# Patient Record
Sex: Male | Born: 1955
Health system: Southern US, Community
[De-identification: ages and names within clinical notes are randomized; demographics above are authoritative.]

## PROBLEM LIST (undated history)

## (undated) DIAGNOSIS — R55 Syncope and collapse: Secondary | ICD-10-CM

## (undated) DIAGNOSIS — E785 Hyperlipidemia, unspecified: Secondary | ICD-10-CM

## (undated) DIAGNOSIS — F339 Major depressive disorder, recurrent, unspecified: Secondary | ICD-10-CM

## (undated) DIAGNOSIS — R0600 Dyspnea, unspecified: Secondary | ICD-10-CM

## (undated) DIAGNOSIS — M5432 Sciatica, left side: Secondary | ICD-10-CM

## (undated) DIAGNOSIS — R0602 Shortness of breath: Secondary | ICD-10-CM

## (undated) DIAGNOSIS — F4310 Post-traumatic stress disorder, unspecified: Secondary | ICD-10-CM

## (undated) DIAGNOSIS — M1990 Unspecified osteoarthritis, unspecified site: Secondary | ICD-10-CM

## (undated) DIAGNOSIS — L02212 Cutaneous abscess of back [any part, except buttock]: Secondary | ICD-10-CM

## (undated) DIAGNOSIS — R001 Bradycardia, unspecified: Secondary | ICD-10-CM

## (undated) DIAGNOSIS — K5900 Constipation, unspecified: Secondary | ICD-10-CM

## (undated) DIAGNOSIS — D72829 Elevated white blood cell count, unspecified: Secondary | ICD-10-CM

## (undated) DIAGNOSIS — R739 Hyperglycemia, unspecified: Secondary | ICD-10-CM

## (undated) HISTORY — DX: Hyperlipidemia, unspecified: E78.5

## (undated) HISTORY — PX: CARDIAC CATHETERIZATION: SHX172

## (undated) HISTORY — DX: Shortness of breath: R06.02

## (undated) HISTORY — DX: Dyspnea, unspecified: R06.00

## (undated) HISTORY — DX: Cutaneous abscess of back (any part, except buttock): L02.212

## (undated) HISTORY — DX: Hyperglycemia, unspecified: R73.9

## (undated) HISTORY — DX: Major depressive disorder, recurrent, unspecified: F33.9

## (undated) HISTORY — DX: Unspecified osteoarthritis, unspecified site: M19.90

## (undated) HISTORY — PX: CYST EXCISION: SHX5701

## (undated) HISTORY — DX: Elevated white blood cell count, unspecified: D72.829

## (undated) HISTORY — DX: Bradycardia, unspecified: R00.1

## (undated) HISTORY — DX: Syncope and collapse: R55

## (undated) HISTORY — DX: Post-traumatic stress disorder, unspecified: F43.10

## (undated) HISTORY — PX: VASECTOMY: SHX75

---

## 1998-04-20 ENCOUNTER — Emergency Department (HOSPITAL_COMMUNITY): Admission: EM | Admit: 1998-04-20 | Discharge: 1998-04-20 | Payer: Self-pay | Admitting: Emergency Medicine

## 1998-04-20 ENCOUNTER — Encounter: Payer: Self-pay | Admitting: Emergency Medicine

## 2001-07-23 ENCOUNTER — Encounter: Admission: RE | Admit: 2001-07-23 | Discharge: 2001-07-23 | Payer: Self-pay

## 2001-10-29 ENCOUNTER — Encounter: Admission: RE | Admit: 2001-10-29 | Discharge: 2001-11-03 | Payer: Self-pay

## 2001-11-05 ENCOUNTER — Encounter: Admission: RE | Admit: 2001-11-05 | Discharge: 2001-11-05 | Payer: Self-pay

## 2002-05-20 ENCOUNTER — Encounter: Admission: RE | Admit: 2002-05-20 | Discharge: 2002-05-20 | Payer: Self-pay | Admitting: Family Medicine

## 2002-05-21 ENCOUNTER — Encounter: Payer: Self-pay | Admitting: Sports Medicine

## 2002-05-21 ENCOUNTER — Encounter: Admission: RE | Admit: 2002-05-21 | Discharge: 2002-05-21 | Payer: Self-pay | Admitting: Sports Medicine

## 2002-06-09 ENCOUNTER — Encounter: Admission: RE | Admit: 2002-06-09 | Discharge: 2002-06-09 | Payer: Self-pay | Admitting: Family Medicine

## 2002-06-18 ENCOUNTER — Encounter: Admission: RE | Admit: 2002-06-18 | Discharge: 2002-06-18 | Payer: Self-pay | Admitting: Family Medicine

## 2003-01-14 ENCOUNTER — Encounter: Admission: RE | Admit: 2003-01-14 | Discharge: 2003-01-14 | Payer: Self-pay | Admitting: Sports Medicine

## 2006-04-03 DIAGNOSIS — F339 Major depressive disorder, recurrent, unspecified: Secondary | ICD-10-CM | POA: Insufficient documentation

## 2006-04-03 DIAGNOSIS — F4310 Post-traumatic stress disorder, unspecified: Secondary | ICD-10-CM | POA: Insufficient documentation

## 2006-04-03 DIAGNOSIS — I10 Essential (primary) hypertension: Secondary | ICD-10-CM | POA: Insufficient documentation

## 2011-04-09 ENCOUNTER — Ambulatory Visit (INDEPENDENT_AMBULATORY_CARE_PROVIDER_SITE_OTHER): Payer: Medicare Other | Admitting: General Surgery

## 2011-04-09 ENCOUNTER — Encounter (INDEPENDENT_AMBULATORY_CARE_PROVIDER_SITE_OTHER): Payer: Self-pay | Admitting: General Surgery

## 2011-04-09 VITALS — BP 136/82 | HR 67 | Temp 97.8°F | Ht 67.0 in | Wt 175.2 lb

## 2011-04-09 DIAGNOSIS — L02219 Cutaneous abscess of trunk, unspecified: Secondary | ICD-10-CM

## 2011-04-09 DIAGNOSIS — L02212 Cutaneous abscess of back [any part, except buttock]: Secondary | ICD-10-CM

## 2011-04-09 MED ORDER — HYDROCODONE-ACETAMINOPHEN 5-325 MG PO TABS: 1.0000 | ORAL_TABLET | Freq: Four times a day (QID) | ORAL | Status: AC | PRN

## 2011-04-09 NOTE — Progress Notes (Signed)
Patient ID: Frank Gill, male   DOB: 02/23/1955, 56 y.o.   MRN: [id number]  Chief Complaint  Patient presents with  . Follow-up    seb cyst on back    [name] is a 56 y.o. male.   HPI  He is referred by Helene Kelp, PA for evaluation of an infected sebaceous cyst of the back. He presented to his primary care physician's office February 25 and was noted to have cellulitis and abscess on the back. He was started on Bactrim. He was seen yesterday and the area is larger.  It has begun to spontaneously drain some.  He is referred here for further evaluation and treatment.  Past Medical History  Diagnosis Date  . Arthritis   . Hyperlipidemia     History reviewed. No pertinent past surgical history.  History reviewed. No pertinent family history.  Social History History  Substance Use Topics  . Smoking status: Current Everyday Smoker  . Smokeless tobacco: Not on file  . Alcohol Use: Yes     occ    No Known Allergies  Current Outpatient Prescriptions  Medication Sig Dispense Refill  . calcium-vitamin D (OSCAL WITH D) 500-200 MG-UNIT per tablet Take 1 tablet by mouth daily.      Marland Kitchen LIPOFEN 150 MG CAPS       . loratadine (CLARITIN) 10 MG tablet Take 10 mg by mouth daily.      . mupirocin ointment (BACTROBAN) 2 %       . naproxen (NAPROSYN) 250 MG tablet Take by mouth 2 (two) times daily with a meal.      . sulfamethoxazole-trimethoprim (BACTRIM DS) 800-160 MG per tablet       . traMADol (ULTRAM) 50 MG tablet       . HYDROcodone-acetaminophen (NORCO) 5-325 MG per tablet Take 1 tablet by mouth every 6 (six) hours as needed for pain.  20 tablet  1    Review of Systems Review of Systems  Constitutional: Negative for fever and chills.  Musculoskeletal: Positive for back pain (at cyst site).    Blood pressure 136/82, pulse 67, temperature 97.8 F (36.6 C), temperature source Temporal, height 5\' 7"  (1.702 m), weight 175 lb 3.2 oz (79.47 kg), SpO2 96.00%.  Physical  Exam Physical Exam  Constitutional: He appears well-developed and well-nourished. No distress.  Musculoskeletal:       6 cm red, fluctuant area in mid back.    Data Reviewed Note from Helene Kelp, PA  Assessment    Back abscess secondary to infected sebaceous cyst that is not responding to antibiotic treatment.    Plan    Incision and drainage here in the office. Removed a bandage Thursday morning and then clean the area in the shower twice a day and place a dry bandage over the wound. Continue the antibiotics until they are gone. We'll give him Vicodin for pain. Return visit 4 weeks.  Procedure: The abscess in the back were sterilely prepped. It was anesthetized with Xylocaine. A full thickness elliptical incision was made and a plug of skin and subcutaneous tissue removed. A significant amount of purulent debris and fluid was evacuated. A cyst capsule was identified and debrided. Bleeding was controlled with cautery. The wound was packed with iodoform gauze. A bulky dressing was applied. He tolerated the procedure well.        [name] 04/09/2011, 5:22 PM

## 2011-04-09 NOTE — Patient Instructions (Signed)
Remove bandage Thursday morning then clean the area in the shower twice a day and apply a dry dressing.  Call for heavy bleeding or other wound problems.

## 2011-05-01 ENCOUNTER — Encounter (INDEPENDENT_AMBULATORY_CARE_PROVIDER_SITE_OTHER): Payer: Medicare Other | Admitting: General Surgery

## 2012-06-01 ENCOUNTER — Other Ambulatory Visit: Payer: Self-pay | Admitting: *Deleted

## 2012-06-01 MED ORDER — TRAMADOL HCL 50 MG PO TABS: ORAL_TABLET | ORAL | Status: DC

## 2012-06-01 NOTE — Telephone Encounter (Signed)
Ultram RX sent to pharmacy. NTBS

## 2012-06-01 NOTE — Telephone Encounter (Signed)
LAST REFILL 04/10/12. NOT SEEN SINCE 10/14/11 BY ACM. PLEASE PRINT AND CALL PATIENT.

## 2012-06-25 ENCOUNTER — Other Ambulatory Visit: Payer: Self-pay

## 2012-07-07 ENCOUNTER — Other Ambulatory Visit: Payer: Self-pay | Admitting: Nurse Practitioner

## 2012-07-08 MED ORDER — TRAMADOL HCL 50 MG PO TABS: ORAL_TABLET | ORAL | Status: DC

## 2012-07-08 NOTE — Telephone Encounter (Signed)
Last filled 06/01/12, has appt 07/24/12

## 2012-07-24 ENCOUNTER — Ambulatory Visit: Payer: Self-pay | Admitting: Nurse Practitioner

## 2012-07-31 ENCOUNTER — Other Ambulatory Visit: Payer: Self-pay | Admitting: *Deleted

## 2012-07-31 ENCOUNTER — Other Ambulatory Visit: Payer: Self-pay | Admitting: Family Medicine

## 2012-07-31 MED ORDER — NAPROXEN SODIUM 550 MG PO TABS: 550.0000 mg | ORAL_TABLET | Freq: Two times a day (BID) | ORAL | Status: DC

## 2012-08-04 ENCOUNTER — Other Ambulatory Visit: Payer: Self-pay | Admitting: Family Medicine

## 2012-08-04 MED ORDER — TRAMADOL HCL 50 MG PO TABS: ORAL_TABLET | ORAL | Status: DC

## 2012-08-04 MED ORDER — ATORVASTATIN CALCIUM 20 MG PO TABS: 20.0000 mg | ORAL_TABLET | Freq: Every day | ORAL | Status: DC

## 2012-08-04 NOTE — Telephone Encounter (Signed)
Refilled medicines for one month and will need to come in and be seen.

## 2012-08-04 NOTE — Telephone Encounter (Signed)
Tramadol last filled 07/08/12, last seen 09/13, but has appt with you 08/10/12. If approved have your nurse call pt for pickup, it will print

## 2012-08-05 ENCOUNTER — Other Ambulatory Visit: Payer: Self-pay | Admitting: *Deleted

## 2012-08-05 NOTE — Telephone Encounter (Signed)
LAST RF 02/17/12. PLEASE PRINT AND HAVE NURSE CALL PT IF APPROVED. THANKS.

## 2012-08-06 NOTE — Telephone Encounter (Signed)
Patient needsto follow-up

## 2012-08-10 ENCOUNTER — Other Ambulatory Visit: Payer: Self-pay

## 2012-08-10 ENCOUNTER — Ambulatory Visit: Payer: Self-pay | Admitting: Family Medicine

## 2012-08-10 NOTE — Telephone Encounter (Signed)
Being seen 08/11/12  Frank Gill    If approved print and have nurse call patient to pick up

## 2012-08-10 NOTE — Telephone Encounter (Signed)
Refill medicine in follow up visit tomorrow.

## 2012-08-10 NOTE — Telephone Encounter (Signed)
No labs for lipids since EPIC  Has appt 08/11/12  [name]    If approved print Tramadol and have nurse call patient to pick up

## 2012-08-11 ENCOUNTER — Encounter: Payer: Self-pay | Admitting: Family Medicine

## 2012-08-11 ENCOUNTER — Ambulatory Visit (INDEPENDENT_AMBULATORY_CARE_PROVIDER_SITE_OTHER): Payer: Medicare Other

## 2012-08-11 ENCOUNTER — Ambulatory Visit (INDEPENDENT_AMBULATORY_CARE_PROVIDER_SITE_OTHER): Payer: Medicare Other | Admitting: Family Medicine

## 2012-08-11 VITALS — BP 132/81 | HR 56 | Temp 97.2°F | Wt 162.8 lb

## 2012-08-11 DIAGNOSIS — Z0000 Encounter for general adult medical examination without abnormal findings: Secondary | ICD-10-CM

## 2012-08-11 DIAGNOSIS — M549 Dorsalgia, unspecified: Secondary | ICD-10-CM

## 2012-08-11 DIAGNOSIS — E785 Hyperlipidemia, unspecified: Secondary | ICD-10-CM

## 2012-08-11 LAB — POCT CBC
Granulocyte percent: 64.8 %G (ref 37–80)
HCT, POC: 46.9 % (ref 43.5–53.7)
Hemoglobin: 16.9 g/dL (ref 14.1–18.1)
Lymph, poc: 2.3 (ref 0.6–3.4)
MCH, POC: 33.4 pg — AB (ref 27–31.2)
MCHC: 36.1 g/dL — AB (ref 31.8–35.4)
MCV: 92.7 fL (ref 80–97)
MPV: 9.4 fL (ref 0–99.8)
POC Granulocyte: 4.6 (ref 2–6.9)
POC LYMPH PERCENT: 31.7 %L (ref 10–50)
Platelet Count, POC: 202 10*3/uL (ref 142–424)
RBC: 5.1 M/uL (ref 4.69–6.13)
RDW, POC: 13 %
WBC: 7.1 10*3/uL (ref 4.6–10.2)

## 2012-08-11 LAB — COMPLETE METABOLIC PANEL WITH GFR
ALT: 21 U/L (ref 0–53)
AST: 24 U/L (ref 0–37)
Albumin: 4.4 g/dL (ref 3.5–5.2)
Alkaline Phosphatase: 72 U/L (ref 39–117)
BUN: 17 mg/dL (ref 6–23)
CO2: 31 mEq/L (ref 19–32)
Calcium: 9.6 mg/dL (ref 8.4–10.5)
Chloride: 106 mEq/L (ref 96–112)
Creat: 0.93 mg/dL (ref 0.50–1.35)
GFR, Est African American: >89 mL/min
GFR, Est Non African American: >89 mL/min
Glucose, Bld: 99 mg/dL (ref 70–99)
Potassium: 4.5 mEq/L (ref 3.5–5.3)
Sodium: 142 mEq/L (ref 135–145)
Total Bilirubin: 0.5 mg/dL (ref 0.3–1.2)
Total Protein: 6.6 g/dL (ref 6.0–8.3)

## 2012-08-11 LAB — LIPID PANEL
Cholesterol: 125 mg/dL (ref 0–200)
HDL: 28 mg/dL — ABNORMAL LOW (ref >39)
LDL Cholesterol: 67 mg/dL (ref 0–99)
Total CHOL/HDL Ratio: 4.5 Ratio
Triglycerides: 151 mg/dL — ABNORMAL HIGH (ref <150)
VLDL: 30 mg/dL (ref 0–40)

## 2012-08-11 LAB — TSH: TSH: 0.483 u[IU]/mL (ref 0.350–4.500)

## 2012-08-11 LAB — PSA: PSA: 0.74 ng/mL (ref <=4.00)

## 2012-08-11 MED ORDER — NAPROXEN SODIUM 550 MG PO TABS: 550.0000 mg | ORAL_TABLET | Freq: Two times a day (BID) | ORAL | Status: DC

## 2012-08-11 MED ORDER — FENOFIBRATE 150 MG PO CAPS: 150.0000 mg | ORAL_CAPSULE | ORAL | Status: DC

## 2012-08-11 MED ORDER — TRAMADOL HCL 50 MG PO TABS: 50.0000 mg | ORAL_TABLET | Freq: Four times a day (QID) | ORAL | Status: DC | PRN

## 2012-08-11 MED ORDER — GABAPENTIN 300 MG PO CAPS: 300.0000 mg | ORAL_CAPSULE | Freq: Three times a day (TID) | ORAL | Status: DC

## 2012-08-11 MED ORDER — ATORVASTATIN CALCIUM 20 MG PO TABS: 20.0000 mg | ORAL_TABLET | Freq: Every day | ORAL | Status: DC

## 2012-08-11 NOTE — Progress Notes (Signed)
Subjective:    Patient ID: Frank Gill, male    DOB: Aug 11, 1955, 57 y.o.   MRN: [id number]  HPI This 57 y.o. male presents for evaluation of follow up on back pain which is radiating down his left leg.  He c/o left leg pain which is worse when he walks.  He denies any strength issues.  He has hx of DDD of the LS spine and radicular symptoms.  He states in the past he did see a neurosurgeon who advised him not to get surgery.  He does not want back surgery.  He is in moderate to severe pain in his back and has a lot of pain radiating down his left leg.   Review of Systems   C/o back pain with pain radiating down left leg. No chest pain, SOB, HA, dizziness, vision change, N/V, diarrhea, constipation, dysuria, urinary urgency or frequency or rash.  Objective:   Physical Exam Vital signs noted  Well developed well nourished male.  HEENT - Head atraumatic Normocephalic                Eyes - PERRLA, Conjuctiva - clear Sclera- Clear EOMI                Ears - EAC's Wnl TM's Wnl Gross Hearing WNL                Nose - Nares patent                 Throat - oropharanx wnl Respiratory - Lungs CTA bilateral Cardiac - RRR S1 and S2 without murmur GI - Abdomen soft Nontender and bowel sounds active x 4 Extremities - No edema. Neuro - Grossly intact. MS- TTP LS spine and negative SLR bilateral.   Results for orders placed in visit on 08/11/12 (from the past 24 hour(s))  POCT CBC     Status: Abnormal   Collection Time    08/11/12  9:54 AM      Result Value Range   WBC 7.1  4.6 - 10.2 K/uL   Lymph, poc 2.3  0.6 - 3.4   POC LYMPH PERCENT 31.7  10 - 50 %L   MID (cbc)    0 - 0.9   POC MID %    0 - 12 %M   POC Granulocyte 4.6  2 - 6.9   Granulocyte percent 64.8  37 - 80 %G   RBC 5.1  4.69 - 6.13 M/uL   Hemoglobin 16.9  14.1 - 18.1 g/dL   HCT, POC 40.9  81.1 - 53.7 %   MCV 92.7  80 - 97 fL   MCH, POC 33.4 (*) 27 - 31.2 pg   MCHC 36.1 (*) 31.8 - 35.4 g/dL   RDW, POC 91.4     Platelet  Count, POC 202.0  142 - 424 K/uL   MPV 9.4  0 - 99.8 fL     Preliminary LS xray read - DDD of the LS spine with decreased disk space L4-5. Assessment & Plan:  Back pain with radiation - Plan: traMADol (ULTRAM) 50 MG tablet, naproxen sodium (ANAPROX) 550 MG tablet, gabapentin (NEURONTIN) 300 MG capsule, DG Lumbar Spine 2-3 Views. Discussed that if pain is intractable or worsens then he may need referral to pain management. Discussed taking neurontin first qhs for a week then increasing slowly up to tid.  Other and unspecified hyperlipidemia - Plan: Fenofibrate (LIPOFEN) 150 MG CAPS, atorvastatin (LIPITOR) 20 MG tablet, Lipid panel  Routine general medical examination at a health care facility - Plan: POCT CBC, Lipid panel, TSH, COMPLETE METABOLIC PANEL WITH GFR, PSA  Follow up in 3 months

## 2012-08-11 NOTE — Patient Instructions (Signed)
Back Pain, Adult  Low back pain is very common. About 1 in 5 people have back pain. The cause of low back pain is rarely dangerous. The pain often gets better over time. About half of people with a sudden onset of back pain feel better in just 2 weeks. About 8 in 10 people feel better by 6 weeks.   CAUSES  Some common causes of back pain include:  · Strain of the muscles or ligaments supporting the spine.  · Wear and tear (degeneration) of the spinal discs.  · Arthritis.  · Direct injury to the back.  DIAGNOSIS  Most of the time, the direct cause of low back pain is not known. However, back pain can be treated effectively even when the exact cause of the pain is unknown. Answering your caregiver's questions about your overall health and symptoms is one of the most accurate ways to make sure the cause of your pain is not dangerous. If your caregiver needs more information, he or she may order lab work or imaging tests (X-rays or MRIs). However, even if imaging tests show changes in your back, this usually does not require surgery.  HOME CARE INSTRUCTIONS  For many people, back pain returns. Since low back pain is rarely dangerous, it is often a condition that people can learn to manage on their own.   · Remain active. It is stressful on the back to sit or stand in one place. Do not sit, drive, or stand in one place for more than 30 minutes at a time. Take short walks on level surfaces as soon as pain allows. Try to increase the length of time you walk each day.  · Do not stay in bed. Resting more than 1 or 2 days can delay your recovery.  · Do not avoid exercise or work. Your body is made to move. It is not dangerous to be active, even though your back may hurt. Your back will likely heal faster if you return to being active before your pain is gone.  · Pay attention to your body when you  bend and lift. Many people have less discomfort when lifting if they bend their knees, keep the load close to their bodies, and  avoid twisting. Often, the most comfortable positions are those that put less stress on your recovering back.  · Find a comfortable position to sleep. Use a firm mattress and lie on your side with your knees slightly bent. If you lie on your back, put a pillow under your knees.  · Only take over-the-counter or prescription medicines as directed by your caregiver. Over-the-counter medicines to reduce pain and inflammation are often the most helpful. Your caregiver may prescribe muscle relaxant drugs. These medicines help dull your pain so you can more quickly return to your normal activities and healthy exercise.  · Put ice on the injured area.  · Put ice in a plastic bag.  · Place a towel between your skin and the bag.  · Leave the ice on for 15-20 minutes, 3-4 times a day for the first 2 to 3 days. After that, ice and heat may be alternated to reduce pain and spasms.  · Ask your caregiver about trying back exercises and gentle massage. This may be of some benefit.  · Avoid feeling anxious or stressed. Stress increases muscle tension and can worsen back pain. It is important to recognize when you are anxious or stressed and learn ways to manage it. Exercise is a great option.  SEEK MEDICAL CARE IF:  · You have pain that is not relieved with rest or   medicine.  · You have pain that does not improve in 1 week.  · You have new symptoms.  · You are generally not feeling well.  SEEK IMMEDIATE MEDICAL CARE IF:   · You have pain that radiates from your back into your legs.  · You develop new bowel or bladder control problems.  · You have unusual weakness or numbness in your arms or legs.  · You develop nausea or vomiting.  · You develop abdominal pain.  · You feel faint.  Document Released: 01/21/2005 Document Revised: 07/23/2011 Document Reviewed: 06/11/2010  [employer]® Patient Information ©2014 [employer].

## 2012-08-11 NOTE — Telephone Encounter (Signed)
Pt seen this am with Frank Gill

## 2012-08-11 NOTE — Telephone Encounter (Signed)
Please advise 

## 2012-08-11 NOTE — Telephone Encounter (Signed)
Pt seen this am with [name] and rx given at office visit for tramadol

## 2012-09-10 ENCOUNTER — Telehealth: Payer: Self-pay | Admitting: Family Medicine

## 2012-09-10 NOTE — Telephone Encounter (Signed)
Per last office note patient should be taking atorvastatin 20mg . Notified caregiver. Verbalized understanding

## 2012-10-15 ENCOUNTER — Telehealth: Payer: Self-pay | Admitting: Family Medicine

## 2012-10-16 ENCOUNTER — Other Ambulatory Visit: Payer: Self-pay

## 2012-10-16 DIAGNOSIS — M549 Dorsalgia, unspecified: Secondary | ICD-10-CM

## 2012-10-16 NOTE — Telephone Encounter (Signed)
ast seen 08/11/12  B [place]   If approved print and route to nurse

## 2012-10-19 MED ORDER — TRAMADOL HCL 50 MG PO TABS: 50.0000 mg | ORAL_TABLET | Freq: Four times a day (QID) | ORAL | Status: DC | PRN

## 2012-10-19 NOTE — Telephone Encounter (Signed)
Pt aware, rx ready. 

## 2012-10-21 ENCOUNTER — Other Ambulatory Visit: Payer: Self-pay | Admitting: Family Medicine

## 2012-10-21 ENCOUNTER — Telehealth: Payer: Self-pay | Admitting: *Deleted

## 2012-10-21 DIAGNOSIS — M549 Dorsalgia, unspecified: Secondary | ICD-10-CM

## 2012-10-21 MED ORDER — TRAMADOL HCL 50 MG PO TABS: 50.0000 mg | ORAL_TABLET | Freq: Four times a day (QID) | ORAL | Status: DC | PRN

## 2012-10-21 NOTE — Telephone Encounter (Signed)
Unable to contact pt to inform that rx for tramadol was printed and ready.

## 2012-10-21 NOTE — Telephone Encounter (Signed)
Refill sent.

## 2012-11-17 ENCOUNTER — Ambulatory Visit: Payer: Medicare Other | Admitting: Family Medicine

## 2012-12-15 ENCOUNTER — Ambulatory Visit: Payer: Medicare Other | Admitting: Family Medicine

## 2013-01-04 ENCOUNTER — Ambulatory Visit (INDEPENDENT_AMBULATORY_CARE_PROVIDER_SITE_OTHER): Payer: Medicare Other | Admitting: Family Medicine

## 2013-01-04 ENCOUNTER — Encounter: Payer: Self-pay | Admitting: Family Medicine

## 2013-01-04 VITALS — BP 147/87 | HR 54 | Temp 97.1°F | Ht 65.0 in | Wt 165.0 lb

## 2013-01-04 DIAGNOSIS — Z23 Encounter for immunization: Secondary | ICD-10-CM

## 2013-01-04 DIAGNOSIS — M549 Dorsalgia, unspecified: Secondary | ICD-10-CM

## 2013-01-04 DIAGNOSIS — E785 Hyperlipidemia, unspecified: Secondary | ICD-10-CM

## 2013-01-04 MED ORDER — TRAMADOL HCL 50 MG PO TABS: 50.0000 mg | ORAL_TABLET | Freq: Four times a day (QID) | ORAL | Status: DC | PRN

## 2013-01-04 MED ORDER — FENOFIBRATE 150 MG PO CAPS: 150.0000 mg | ORAL_CAPSULE | ORAL | Status: DC

## 2013-01-04 MED ORDER — NAPROXEN SODIUM 550 MG PO TABS: 550.0000 mg | ORAL_TABLET | Freq: Two times a day (BID) | ORAL | Status: DC

## 2013-01-04 MED ORDER — GABAPENTIN 600 MG PO TABS: 600.0000 mg | ORAL_TABLET | Freq: Three times a day (TID) | ORAL | Status: DC

## 2013-01-04 NOTE — Addendum Note (Signed)
Addended by: Orma Render F on: 01/04/2013 12:16 PM   Modules accepted: Orders

## 2013-01-04 NOTE — Patient Instructions (Addendum)
Back Pain, Adult Low back pain is very common. About 1 in 5 people have back pain.The cause of low back pain is rarely dangerous. The pain often gets better over time.About half of people with a sudden onset of back pain feel better in just 2 weeks. About 8 in 10 people feel better by 6 weeks.  CAUSES Some common causes of back pain include:  Strain of the muscles or ligaments supporting the spine.  Wear and tear (degeneration) of the spinal discs.  Arthritis.  Direct injury to the back. DIAGNOSIS Most of the time, the direct cause of low back pain is not known.However, back pain can be treated effectively even when the exact cause of the pain is unknown.Answering your caregiver's questions about your overall health and symptoms is one of the most accurate ways to make sure the cause of your pain is not dangerous. If your caregiver needs more information, he or she may order lab work or imaging tests (X-rays or MRIs).However, even if imaging tests show changes in your back, this usually does not require surgery. HOME CARE INSTRUCTIONS For many people, back pain returns.Since low back pain is rarely dangerous, it is often a condition that people can learn to manageon their own.   Remain active. It is stressful on the back to sit or stand in one place. Do not sit, drive, or stand in one place for more than 30 minutes at a time. Take short walks on level surfaces as soon as pain allows.Try to increase the length of time you walk each day.  Do not stay in bed.Resting more than 1 or 2 days can delay your recovery.  Do not avoid exercise or work.Your body is made to move.It is not dangerous to be active, even though your back may hurt.Your back will likely heal faster if you return to being active before your pain is gone.  Pay attention to your body when you bend and lift. Many people have less discomfortwhen lifting if they bend their knees, keep the load close to their bodies,and  avoid twisting. Often, the most comfortable positions are those that put less stress on your recovering back.  Find a comfortable position to sleep. Use a firm mattress and lie on your side with your knees slightly bent. If you lie on your back, put a pillow under your knees.  Only take over-the-counter or prescription medicines as directed by your caregiver. Over-the-counter medicines to reduce pain and inflammation are often the most helpful.Your caregiver may prescribe muscle relaxant drugs.These medicines help dull your pain so you can more quickly return to your normal activities and healthy exercise.  Put ice on the injured area.  Put ice in a plastic bag.  Place a towel between your skin and the bag.  Leave the ice on for 15-20 minutes, 03-04 times a day for the first 2 to 3 days. After that, ice and heat may be alternated to reduce pain and spasms.  Ask your caregiver about trying back exercises and gentle massage. This may be of some benefit.  Avoid feeling anxious or stressed.Stress increases muscle tension and can worsen back pain.It is important to recognize when you are anxious or stressed and learn ways to manage it.Exercise is a great option. SEEK MEDICAL CARE IF:  You have pain that is not relieved with rest or medicine.  You have pain that does not improve in 1 week.  You have new symptoms.  You are generally not feeling well. SEEK   IMMEDIATE MEDICAL CARE IF:   You have pain that radiates from your back into your legs.  You develop new bowel or bladder control problems.  You have unusual weakness or numbness in your arms or legs.  You develop nausea or vomiting.  You develop abdominal pain.  You feel faint. Document Released: 01/21/2005 Document Revised: 07/23/2011 Document Reviewed: 06/11/2010 [facility] Patient Information 2014 [place], [place]. Tetanus, Diphtheria, Pertussis (Tdap) Vaccine What You Need to Know WHY GET VACCINATED? Tetanus, diphtheria  and pertussis can be very serious diseases, even for adolescents and adults. Tdap vaccine can protect [place] from these diseases. TETANUS (Lockjaw) causes painful muscle tightening and stiffness, usually all over the body.  It can lead to tightening of muscles in the head and neck so you can't open your mouth, swallow, or sometimes even breathe. Tetanus kills about 1 out of 5 people who are infected. DIPHTHERIA can cause a thick coating to form in the back of the throat.  It can lead to breathing problems, paralysis, heart failure, and death. PERTUSSIS (Whooping Cough) causes severe coughing spells, which can cause difficulty breathing, vomiting and disturbed sleep.  It can also lead to weight loss, incontinence, and rib fractures. Up to 2 in 100 adolescents and 5 in 100 adults with pertussis are hospitalized or have complications, which could include pneumonia and death. These diseases are caused by bacteria. Diphtheria and pertussis are spread from person to person through coughing or sneezing. Tetanus enters the body through cuts, scratches, or wounds. Before vaccines, the [place] saw as many as 200,000 cases a year of diphtheria and pertussis, and hundreds of cases of tetanus. Since vaccination began, tetanus and diphtheria have dropped by about 99% and pertussis by about 80%. TDAP VACCINE Tdap vaccine can protect adolescents and adults from tetanus, diphtheria, and pertussis. One dose of Tdap is routinely given at age 19 or 44. People who did not get Tdap at that age should get it as soon as possible. Tdap is especially important for health care professionals and anyone having close contact with a baby younger than 12 months. Pregnant women should get a dose of Tdap during every pregnancy, to protect the newborn from pertussis. Infants are most at risk for severe, life-threatening complications from pertussis. A similar vaccine, called Td, protects from tetanus and diphtheria, but not  pertussis. A Td booster should be given every 10 years. Tdap may be given as one of these boosters if you have not already gotten a dose. Tdap may also be given after a severe cut or burn to prevent tetanus infection. Your doctor can give you more information. Tdap may safely be given at the same time as other vaccines. SOME PEOPLE SHOULD NOT GET THIS VACCINE  If you ever had a life-threatening allergic reaction after a dose of any tetanus, diphtheria, or pertussis containing vaccine, OR if you have a severe allergy to any part of this vaccine, you should not get Tdap. Tell your doctor if you have any severe allergies.  If you had a coma, or long or multiple seizures within 7 days after a childhood dose of DTP or DTaP, you should not get Tdap, unless a cause other than the vaccine was found. You can still get Td.  Talk to your doctor if you:  have epilepsy or another nervous system problem,  had severe pain or swelling after any vaccine containing diphtheria, tetanus or pertussis,  ever had Guillain-Barr Syndrome (GBS),  aren't feeling well on the day the shot  is scheduled. RISKS OF A VACCINE REACTION With any medicine, including vaccines, there is a chance of side effects. These are usually mild and go away on their own, but serious reactions are also possible. Brief fainting spells can follow a vaccination, leading to injuries from falling. Sitting or lying down for about 15 minutes can help prevent these. Tell your doctor if you feel dizzy or light-headed, or have vision changes or ringing in the ears. Mild problems following Tdap (Did not interfere with activities)  Pain where the shot was given (about 3 in 4 adolescents or 2 in 3 adults)  Redness or swelling where the shot was given (about 1 person in 5)  Mild fever of at least 100.33F (up to about 1 in 25 adolescents or 1 in 100 adults)  Headache (about 3 or 4 people in 10)  Tiredness (about 1 person in 3 or 4)  Nausea,  vomiting, diarrhea, stomach ache (up to 1 in 4 adolescents or 1 in 10 adults)  Chills, body aches, sore joints, rash, swollen glands (uncommon) Moderate problems following Tdap (Interfered with activities, but did not require medical attention)  Pain where the shot was given (about 1 in 5 adolescents or 1 in 100 adults)  Redness or swelling where the shot was given (up to about 1 in 16 adolescents or 1 in 25 adults)  Fever over 102F (about 1 in 100 adolescents or 1 in 250 adults)  Headache (about 3 in 20 adolescents or 1 in 10 adults)  Nausea, vomiting, diarrhea, stomach ache (up to 1 or 3 people in 100)  Swelling of the entire arm where the shot was given (up to about 3 in 100). Severe problems following Tdap (Unable to perform usual activities, required medical attention)  Swelling, severe pain, bleeding and redness in the arm where the shot was given (rare). A severe allergic reaction could occur after any vaccine (estimated less than 1 in a million doses). WHAT IF THERE IS A SERIOUS REACTION? What should I look for?  Look for anything that concerns you, such as signs of a severe allergic reaction, very high fever, or behavior changes. Signs of a severe allergic reaction can include hives, swelling of the face and throat, difficulty breathing, a fast heartbeat, dizziness, and weakness. These would start a few minutes to a few hours after the vaccination. What should I do?  If you think it is a severe allergic reaction or other emergency that can't wait, call 9-1-1 or get the person to the nearest hospital. Otherwise, call your doctor.  Afterward, the reaction should be reported to the "Vaccine Adverse Event Reporting System" (VAERS). Your doctor might file this report, or you can do it yourself through the VAERS web site at www.vaers.LAgents.no, or by calling 1-731-834-2360. VAERS is only for reporting reactions. They do not give medical advice.  THE NATIONAL VACCINE INJURY  COMPENSATION PROGRAM The National Vaccine Injury Compensation Program (VICP) is a federal program that was created to compensate people who may have been injured by certain vaccines. Persons who believe they may have been injured by a vaccine can learn about the program and about filing a claim by calling 1-832 069 9040 or visiting the VICP website at SpiritualWord.at. HOW CAN I LEARN MORE?  Ask your doctor.  Call your local or state health department.  Contact the Centers for Disease Control and Prevention (CDC):  Call 8085794231 or visit CDC's website at PicCapture.uy. CDC Tdap Vaccine VIS (06/13/11) Document Released: 07/23/2011 Document Revised: 05/18/2012 Document  Reviewed: 05/13/2012 [employer] Patient Information 2014 [place], [place]. Influenza Virus Vaccine injection (Fluarix) What is this medicine? INFLUENZA VIRUS VACCINE (in floo EN zuh VAHY ruhs vak SEEN) helps to reduce the risk of getting influenza also known as the flu. This medicine may be used for other purposes; ask your health care provider or pharmacist if you have questions. COMMON BRAND NAME(S): Fluarix, Fluzone What should I tell my health care provider before I take this medicine? They need to know if you have any of these conditions: -bleeding disorder like hemophilia -fever or infection -Guillain-Barre syndrome or other neurological problems -immune system problems -infection with the human immunodeficiency virus (HIV) or AIDS -low blood platelet counts -multiple sclerosis -an unusual or allergic reaction to influenza virus vaccine, eggs, chicken proteins, latex, gentamicin, other medicines, foods, dyes or preservatives -pregnant or trying to get pregnant -breast-feeding How should I use this medicine? This vaccine is for injection into a muscle. It is given by a health care professional. A copy of Vaccine Information Statements will be given before each vaccination. Read this sheet  carefully each time. The sheet may change frequently. Talk to your pediatrician regarding the use of this medicine in children. Special care may be needed. Overdosage: If you think you have taken too much of this medicine contact a poison control center or emergency room at once. NOTE: This medicine is only for you. Do not share this medicine with others. What if I miss a dose? This does not apply. What may interact with this medicine? -chemotherapy or radiation therapy -medicines that lower your immune system like etanercept, anakinra, infliximab, and adalimumab -medicines that treat or prevent blood clots like warfarin -phenytoin -steroid medicines like prednisone or cortisone -theophylline -vaccines This list may not describe all possible interactions. Give your health care provider a list of all the medicines, herbs, non-prescription drugs, or dietary supplements you use. Also tell them if you smoke, drink alcohol, or use illegal drugs. Some items may interact with your medicine. What should I watch for while using this medicine? Report any side effects that do not go away within 3 days to your doctor or health care professional. Call your health care provider if any unusual symptoms occur within 6 weeks of receiving this vaccine. You may still catch the flu, but the illness is not usually as bad. You cannot get the flu from the vaccine. The vaccine will not protect against colds or other illnesses that may cause fever. The vaccine is needed every year. What side effects may I notice from receiving this medicine? Side effects that you should report to your doctor or health care professional as soon as possible: -allergic reactions like skin rash, itching or hives, swelling of the face, lips, or tongue Side effects that usually do not require medical attention (report to your doctor or health care professional if they continue or are bothersome): -fever -headache -muscle aches and  pains -pain, tenderness, redness, or swelling at site where injected -weak or tired This list may not describe all possible side effects. Call your doctor for medical advice about side effects. You may report side effects to FDA at 1-800-FDA-1088. Where should I keep my medicine? This vaccine is only given in a clinic, pharmacy, doctor's office, or other health care setting and will not be stored at home. NOTE: This sheet is a summary. It may not cover all possible information. If you have questions about this medicine, talk to your doctor, pharmacist, or health care provider.  2014,  Elsevier/Gold Standard. (2007-08-19 09:30:40)

## 2013-01-04 NOTE — Progress Notes (Signed)
   Subjective:    Patient ID: Frank Gill, male    DOB: 03-23-1955, 57 y.o.   MRN: [id number]  HPI  This 57 y.o. male presents for evaluation of periodic follow up.  He has hx of hyperlipidemia and chronic Back pain.  He c/o pain at night that radiates down his left leg.  He has hx of DDD.  Review of Systems No chest pain, SOB, HA, dizziness, vision change, N/V, diarrhea, constipation, dysuria, urinary urgency or frequency, myalgias, arthralgias or rash.     Objective:   Physical Exam Vital signs noted  Well developed well nourished male.  HEENT - Head atraumatic Normocephalic                Eyes - PERRLA, Conjuctiva - clear Sclera- Clear EOMI                Ears - EAC's Wnl TM's Wnl Gross Hearing WNL                Throat - oropharanx wnl Respiratory - Lungs CTA bilateral Cardiac - RRR S1 and S2 without murmur GI - Abdomen soft Nontender and bowel sounds active x 4 Extremities - No edema. Neuro - Grossly intact.       Assessment & Plan:  Hyperlipidemia - Plan: POCT CBC, CMP14+EGFR, Lipid panel, Fenofibrate (LIPOFEN) 150 MG CAPS  Back pain with radiation - Plan: traMADol (ULTRAM) 50 MG tablet, naproxen sodium (ANAPROX) 550 MG tablet, gabapentin (NEURONTIN) 600 MG tablet. Increase neurontin to 600mg  tid.  Recommend MRI of LS spine if not better.  Follow up prn.  Other and unspecified hyperlipidemia - Plan: Fenofibrate (LIPOFEN) 150 MG CAPS  Follow up in 3 months Deatra Canter FNP

## 2013-01-04 NOTE — Addendum Note (Signed)
Addended by: Bearl Mulberry on: 01/04/2013 10:03 AM   Modules accepted: Orders

## 2013-01-05 LAB — LIPID PANEL
Chol/HDL Ratio: 3.4 ratio units (ref 0.0–5.0)
Cholesterol, Total: 126 mg/dL (ref 100–199)
HDL: 37 mg/dL — ABNORMAL LOW (ref >39)
LDL Calculated: 77 mg/dL (ref 0–99)
Triglycerides: 60 mg/dL (ref 0–149)
VLDL Cholesterol Cal: 12 mg/dL (ref 5–40)

## 2013-01-05 LAB — CBC WITH DIFFERENTIAL
Basophils Absolute: 0 10*3/uL (ref 0.0–0.2)
Basos: 1 %
Eos: 11 %
Eosinophils Absolute: 0.7 10*3/uL — ABNORMAL HIGH (ref 0.0–0.4)
HCT: 45.4 % (ref 37.5–51.0)
Hemoglobin: 16 g/dL (ref 12.6–17.7)
Immature Grans (Abs): 0 10*3/uL (ref 0.0–0.1)
Immature Granulocytes: 0 %
Lymphocytes Absolute: 1.7 10*3/uL (ref 0.7–3.1)
Lymphs: 26 %
MCH: 32.5 pg (ref 26.6–33.0)
MCHC: 35.2 g/dL (ref 31.5–35.7)
MCV: 92 fL (ref 79–97)
Monocytes Absolute: 0.5 10*3/uL (ref 0.1–0.9)
Monocytes: 7 %
Neutrophils Absolute: 3.8 10*3/uL (ref 1.4–7.0)
Neutrophils Relative %: 55 %
Platelets: 228 10*3/uL (ref 150–379)
RBC: 4.93 x10E6/uL (ref 4.14–5.80)
RDW: 14.2 % (ref 12.3–15.4)
WBC: 6.7 10*3/uL (ref 3.4–10.8)

## 2013-01-05 LAB — CMP14+EGFR
ALT: 17 IU/L (ref 0–44)
AST: 23 IU/L (ref 0–40)
Albumin/Globulin Ratio: 2.6 — ABNORMAL HIGH (ref 1.1–2.5)
Albumin: 4.7 g/dL (ref 3.5–5.5)
Alkaline Phosphatase: 46 IU/L (ref 39–117)
BUN/Creatinine Ratio: 13 (ref 9–20)
BUN: 14 mg/dL (ref 6–24)
CO2: 25 mmol/L (ref 18–29)
Calcium: 9.5 mg/dL (ref 8.7–10.2)
Chloride: 102 mmol/L (ref 97–108)
Creatinine, Ser: 1.07 mg/dL (ref 0.76–1.27)
GFR calc Af Amer: 88 mL/min/{1.73_m2} (ref >59)
GFR calc non Af Amer: 76 mL/min/{1.73_m2} (ref >59)
Globulin, Total: 1.8 g/dL (ref 1.5–4.5)
Glucose: 81 mg/dL (ref 65–99)
Potassium: 4.2 mmol/L (ref 3.5–5.2)
Sodium: 141 mmol/L (ref 134–144)
Total Bilirubin: 0.4 mg/dL (ref 0.0–1.2)
Total Protein: 6.5 g/dL (ref 6.0–8.5)

## 2013-02-08 ENCOUNTER — Ambulatory Visit (INDEPENDENT_AMBULATORY_CARE_PROVIDER_SITE_OTHER): Payer: Medicare HMO | Admitting: Family Medicine

## 2013-02-08 ENCOUNTER — Inpatient Hospital Stay (HOSPITAL_COMMUNITY)
Admission: EM | Admit: 2013-02-08 | Discharge: 2013-02-09 | DRG: 287 | Disposition: A | Discharge status: Home / Self Care | Payer: Medicare HMO | Attending: Family Medicine | Admitting: Family Medicine

## 2013-02-08 ENCOUNTER — Encounter (HOSPITAL_COMMUNITY): Payer: Self-pay | Admitting: Emergency Medicine

## 2013-02-08 ENCOUNTER — Emergency Department (HOSPITAL_COMMUNITY): Payer: Medicare HMO

## 2013-02-08 ENCOUNTER — Encounter: Payer: Self-pay | Admitting: Family Medicine

## 2013-02-08 VITALS — BP 147/81 | HR 74 | Temp 97.3°F | Ht 65.0 in | Wt 168.9 lb

## 2013-02-08 DIAGNOSIS — Z79899 Other long term (current) drug therapy: Secondary | ICD-10-CM

## 2013-02-08 DIAGNOSIS — R079 Chest pain, unspecified: Secondary | ICD-10-CM

## 2013-02-08 DIAGNOSIS — R0602 Shortness of breath: Secondary | ICD-10-CM

## 2013-02-08 DIAGNOSIS — I249 Acute ischemic heart disease, unspecified: Secondary | ICD-10-CM | POA: Diagnosis present

## 2013-02-08 DIAGNOSIS — I10 Essential (primary) hypertension: Secondary | ICD-10-CM | POA: Diagnosis present

## 2013-02-08 DIAGNOSIS — R9389 Abnormal findings on diagnostic imaging of other specified body structures: Secondary | ICD-10-CM

## 2013-02-08 DIAGNOSIS — R9431 Abnormal electrocardiogram [ECG] [EKG]: Secondary | ICD-10-CM

## 2013-02-08 DIAGNOSIS — E785 Hyperlipidemia, unspecified: Secondary | ICD-10-CM | POA: Diagnosis present

## 2013-02-08 DIAGNOSIS — Z8249 Family history of ischemic heart disease and other diseases of the circulatory system: Secondary | ICD-10-CM

## 2013-02-08 DIAGNOSIS — L02212 Cutaneous abscess of back [any part, except buttock]: Secondary | ICD-10-CM

## 2013-02-08 DIAGNOSIS — F4310 Post-traumatic stress disorder, unspecified: Secondary | ICD-10-CM

## 2013-02-08 DIAGNOSIS — R55 Syncope and collapse: Secondary | ICD-10-CM

## 2013-02-08 DIAGNOSIS — F17200 Nicotine dependence, unspecified, uncomplicated: Secondary | ICD-10-CM | POA: Diagnosis present

## 2013-02-08 DIAGNOSIS — Z7982 Long term (current) use of aspirin: Secondary | ICD-10-CM

## 2013-02-08 DIAGNOSIS — Z720 Tobacco use: Secondary | ICD-10-CM | POA: Diagnosis present

## 2013-02-08 DIAGNOSIS — IMO0002 Reserved for concepts with insufficient information to code with codable children: Secondary | ICD-10-CM

## 2013-02-08 DIAGNOSIS — I200 Unstable angina: Principal | ICD-10-CM | POA: Diagnosis present

## 2013-02-08 DIAGNOSIS — J189 Pneumonia, unspecified organism: Secondary | ICD-10-CM

## 2013-02-08 DIAGNOSIS — F339 Major depressive disorder, recurrent, unspecified: Secondary | ICD-10-CM

## 2013-02-08 DIAGNOSIS — E876 Hypokalemia: Secondary | ICD-10-CM | POA: Diagnosis present

## 2013-02-08 HISTORY — DX: Constipation, unspecified: K59.00

## 2013-02-08 HISTORY — DX: Sciatica, left side: M54.32

## 2013-02-08 HISTORY — DX: Shortness of breath: R06.02

## 2013-02-08 LAB — COMPREHENSIVE METABOLIC PANEL
ALT: 21 U/L (ref 0–53)
AST: 26 U/L (ref 0–37)
Albumin: 4.3 g/dL (ref 3.5–5.2)
Alkaline Phosphatase: 44 U/L (ref 39–117)
BILIRUBIN TOTAL: 0.3 mg/dL (ref 0.3–1.2)
BUN: 15 mg/dL (ref 6–23)
CALCIUM: 8.9 mg/dL (ref 8.4–10.5)
CHLORIDE: 104 meq/L (ref 96–112)
CO2: 27 meq/L (ref 19–32)
CREATININE: 1.08 mg/dL (ref 0.50–1.35)
GFR, EST AFRICAN AMERICAN: 86 mL/min — AB (ref >90)
GFR, EST NON AFRICAN AMERICAN: 74 mL/min — AB (ref >90)
GLUCOSE: 89 mg/dL (ref 70–99)
Potassium: 3.4 mEq/L — ABNORMAL LOW (ref 3.7–5.3)
Sodium: 143 mEq/L (ref 137–147)
Total Protein: 6.9 g/dL (ref 6.0–8.3)

## 2013-02-08 LAB — CBC
HCT: 44 % (ref 39.0–52.0)
HEMOGLOBIN: 15.3 g/dL (ref 13.0–17.0)
MCH: 33 pg (ref 26.0–34.0)
MCHC: 34.8 g/dL (ref 30.0–36.0)
MCV: 94.8 fL (ref 78.0–100.0)
Platelets: 215 10*3/uL (ref 150–400)
RBC: 4.64 MIL/uL (ref 4.22–5.81)
RDW: 14.2 % (ref 11.5–15.5)
WBC: 6.4 10*3/uL (ref 4.0–10.5)

## 2013-02-08 LAB — TROPONIN I: Troponin I: <0.3 ng/mL (ref <0.30)

## 2013-02-08 MED ORDER — NITROGLYCERIN 0.4 MG SL SUBL: 0.4000 mg | SUBLINGUAL_TABLET | SUBLINGUAL | Status: DC | PRN

## 2013-02-08 MED ORDER — LEVOFLOXACIN 750 MG PO TABS
750.0000 mg | ORAL_TABLET | Freq: Once | ORAL | Status: AC
  Administered 2013-02-08: 750 mg via ORAL
  Filled 2013-02-08: qty 1

## 2013-02-08 MED ORDER — ATORVASTATIN CALCIUM 40 MG PO TABS
40.0000 mg | ORAL_TABLET | Freq: Every day | ORAL | Status: DC
  Administered 2013-02-08: 40 mg via ORAL
  Filled 2013-02-08 (×2): qty 1

## 2013-02-08 MED ORDER — FENOFIBRATE 160 MG PO TABS
160.0000 mg | ORAL_TABLET | Freq: Every day | ORAL | Status: DC
  Administered 2013-02-09: 160 mg via ORAL
  Filled 2013-02-08: qty 1

## 2013-02-08 MED ORDER — ADULT MULTIVITAMIN W/MINERALS CH
1.0000 | ORAL_TABLET | Freq: Every day | ORAL | Status: DC
  Administered 2013-02-08 – 2013-02-09 (×2): 1 via ORAL
  Filled 2013-02-08 (×2): qty 1

## 2013-02-08 MED ORDER — GABAPENTIN 600 MG PO TABS
600.0000 mg | ORAL_TABLET | Freq: Three times a day (TID) | ORAL | Status: DC
  Administered 2013-02-08 – 2013-02-09 (×2): 600 mg via ORAL
  Filled 2013-02-08 (×4): qty 1

## 2013-02-08 MED ORDER — TRAMADOL HCL 50 MG PO TABS: 50.0000 mg | ORAL_TABLET | Freq: Four times a day (QID) | ORAL | Status: DC | PRN

## 2013-02-08 MED ORDER — LORATADINE 10 MG PO TABS
10.0000 mg | ORAL_TABLET | Freq: Every day | ORAL | Status: DC
  Administered 2013-02-09: 10 mg via ORAL
  Filled 2013-02-08: qty 1

## 2013-02-08 MED ORDER — DOCUSATE SODIUM 100 MG PO CAPS
100.0000 mg | ORAL_CAPSULE | Freq: Every day | ORAL | Status: DC
  Administered 2013-02-08: 100 mg via ORAL
  Filled 2013-02-08 (×2): qty 1

## 2013-02-08 MED ORDER — HEPARIN BOLUS VIA INFUSION
3500.0000 [IU] | Freq: Once | INTRAVENOUS | Status: AC
  Administered 2013-02-08: 3500 [IU] via INTRAVENOUS
  Filled 2013-02-08: qty 3500

## 2013-02-08 MED ORDER — SODIUM CHLORIDE 0.9 % IJ SOLN
3.0000 mL | Freq: Two times a day (BID) | INTRAMUSCULAR | Status: DC
  Administered 2013-02-08: 3 mL via INTRAVENOUS

## 2013-02-08 MED ORDER — ASPIRIN 81 MG PO TABS: 81.0000 mg | ORAL_TABLET | Freq: Every day | ORAL | Status: DC

## 2013-02-08 MED ORDER — ASPIRIN 81 MG PO CHEW: 81.0000 mg | CHEWABLE_TABLET | Freq: Every day | ORAL | Status: DC

## 2013-02-08 MED ORDER — HEPARIN (PORCINE) IN NACL 100-0.45 UNIT/ML-% IJ SOLN
1200.0000 [IU]/h | INTRAMUSCULAR | Status: DC
  Administered 2013-02-08: 950 [IU]/h via INTRAVENOUS
  Filled 2013-02-08 (×2): qty 250

## 2013-02-08 NOTE — Patient Instructions (Signed)
Chest Pain (Nonspecific) °It is often hard to give a specific diagnosis for the cause of chest pain. There is always a chance that your pain could be related to something serious, such as a heart attack or a blood clot in the lungs. You need to follow up with your caregiver for further evaluation. °CAUSES  °· Heartburn. °· Pneumonia or bronchitis. °· Anxiety or stress. °· Inflammation around your heart (pericarditis) or lung (pleuritis or pleurisy). °· A blood clot in the lung. °· A collapsed lung (pneumothorax). It can develop suddenly on its own (spontaneous pneumothorax) or from injury (trauma) to the chest. °· Shingles infection (herpes zoster virus). °The chest wall is composed of bones, muscles, and cartilage. Any of these can be the source of the pain. °· The bones can be bruised by injury. °· The muscles or cartilage can be strained by coughing or overwork. °· The cartilage can be affected by inflammation and become sore (costochondritis). °DIAGNOSIS  °Lab tests or other studies, such as X-rays, electrocardiography, stress testing, or cardiac imaging, may be needed to find the cause of your pain.  °TREATMENT  °· Treatment depends on what may be causing your chest pain. Treatment may include: °· Acid blockers for heartburn. °· Anti-inflammatory medicine. °· Pain medicine for inflammatory conditions. °· Antibiotics if an infection is present. °· You may be advised to change lifestyle habits. This includes stopping smoking and avoiding alcohol, caffeine, and chocolate. °· You may be advised to keep your head raised (elevated) when sleeping. This reduces the chance of acid going backward from your stomach into your esophagus. °· Most of the time, nonspecific chest pain will improve within 2 to 3 days with rest and mild pain medicine. °HOME CARE INSTRUCTIONS  °· If antibiotics were prescribed, take your antibiotics as directed. Finish them even if you start to feel better. °· For the next few days, avoid physical  activities that bring on chest pain. Continue physical activities as directed. °· Do not smoke. °· Avoid drinking alcohol. °· Only take over-the-counter or prescription medicine for pain, discomfort, or fever as directed by your caregiver. °· Follow your caregiver's suggestions for further testing if your chest pain does not go away. °· Keep any follow-up appointments you made. If you do not go to an appointment, you could develop lasting (chronic) problems with pain. If there is any problem keeping an appointment, you must call to reschedule. °SEEK MEDICAL CARE IF:  °· You think you are having problems from the medicine you are taking. Read your medicine instructions carefully. °· Your chest pain does not go away, even after treatment. °· You develop a rash with blisters on your chest. °SEEK IMMEDIATE MEDICAL CARE IF:  °· You have increased chest pain or pain that spreads to your arm, neck, jaw, back, or abdomen. °· You develop shortness of breath, an increasing cough, or you are coughing up blood. °· You have severe back or abdominal pain, feel nauseous, or vomit. °· You develop severe weakness, fainting, or chills. °· You have a fever. °THIS IS AN EMERGENCY. Do not wait to see if the pain will go away. Get medical help at once. Call your local emergency services (911 in [place] Do not drive yourself to the hospital. °MAKE SURE YOU:  °· Understand these instructions. °· Will watch your condition. °· Will get help right away if you are not doing well or get worse. °Document Released: 10/31/2004 Document Revised: 04/15/2011 Document Reviewed: 08/27/2007 °Exit[employer]® Patient Information ©2014 [employer]   LL[employer]. ° °

## 2013-02-08 NOTE — ED Notes (Signed)
[facility].  Pt received 324 asa and 2 nitro pta with relieve. Pain 1/10 at this time. Pt states syncopal episode on sat.

## 2013-02-08 NOTE — ED Provider Notes (Signed)
CSN: [id number]     Arrival date & time 02/08/13  1725 History   First MD Initiated Contact with Patient 02/08/13 1729     Chief Complaint  Patient presents with  . Chest Pain   (Consider location/radiation/quality/duration/timing/severity/associated sxs/prior Treatment) HPI Comments: 58 yo male with lipids, smoking hx presents with chest pain episode.  Pt had a CP episode with near syncope on Thursday, he was diaphoretic but refused to come in for no specific reason. CP ache/ pressure, non radiating. Pt vague with hx, pt looks to family for details and does not know his medical hx well.  This am he had another epiosode from approx 9 to noon today, similar, no syncope.  Pt has intermittent exertional chest pain.  No known MI or stroke hx.  No acute onset HA.  No blood thinners.  Pt has asa and nitro PTA, no pain on my exam.  No recent surgery, long travel, blood clot hx or leg swelling.  Sent over from [facility] for eval/ admission.  Patient is a 58 y.o. male presenting with chest pain. The history is provided by the patient.  Chest Pain Associated symptoms: diaphoresis and fatigue   Associated symptoms: no abdominal pain, no back pain, no cough, no fever, no headache, no shortness of breath, not vomiting and no weakness     Past Medical History  Diagnosis Date  . Arthritis   . Hyperlipidemia    History reviewed. No pertinent past surgical history. Family History  Problem Relation Age of Onset  . Heart disease Mother   . Cancer Father   . Mental illness Sister    History  Substance Use Topics  . Smoking status: Current Every Day Smoker -- 1.00 packs/day    Types: Cigarettes    Start date: 05/21/1979  . Smokeless tobacco: Not on file  . Alcohol Use: Yes     Comment: occ    Review of Systems  Constitutional: Positive for diaphoresis and fatigue. Negative for fever and chills.  HENT: Negative for congestion.   Eyes: Negative for visual disturbance.  Respiratory: Negative for cough and  shortness of breath.   Cardiovascular: Positive for chest pain. Negative for leg swelling.  Gastrointestinal: Negative for vomiting and abdominal pain.  Genitourinary: Negative for dysuria and flank pain.  Musculoskeletal: Negative for back pain, neck pain and neck stiffness.  Skin: Negative for rash.  Neurological: Negative for weakness, light-headedness and headaches.    Allergies  Review of patient's allergies indicates no known allergies.  Home Medications   Current Outpatient Rx  Name  Route  Sig  Dispense  Refill  . atorvastatin (LIPITOR) 20 MG tablet   Oral   Take 1 tablet (20 mg total) by mouth daily.   30 tablet   3   . calcium-vitamin D (OSCAL WITH D) 500-200 MG-UNIT per tablet   Oral   Take 1 tablet by mouth daily.         . Fenofibrate (LIPOFEN) 150 MG CAPS   Oral   Take 1 capsule (150 mg total) by mouth 1 day or 1 dose.   30 each   3   . gabapentin (NEURONTIN) 600 MG tablet   Oral   Take 1 tablet (600 mg total) by mouth 3 (three) times daily.   90 tablet   11   . loratadine (CLARITIN) 10 MG tablet   Oral   Take 10 mg by mouth daily.         . naproxen sodium (ANAPROX)  550 MG tablet   Oral   Take 1 tablet (550 mg total) by mouth 2 (two) times daily with a meal.   60 tablet   3   . traMADol (ULTRAM) 50 MG tablet   Oral   Take 1 tablet (50 mg total) by mouth every 6 (six) hours as needed. TAKE ONE TABLET BY MOUTH EVERY DAY   60 tablet   3    BP 149/73  Pulse 60  Temp(Src) 97.5 F (36.4 C) (Oral)  Resp 18  SpO2 100% Physical Exam  Nursing note and vitals reviewed. Constitutional: He is oriented to person, place, and time. He appears well-developed and well-nourished.  HENT:  Head: Normocephalic and atraumatic.  Eyes: Conjunctivae are normal. Right eye exhibits no discharge. Left eye exhibits no discharge.  Neck: Normal range of motion. Neck supple. No tracheal deviation present.  Cardiovascular: Normal rate, regular rhythm and intact  distal pulses.   Murmur (2+ USB right SM) heard. Pulmonary/Chest: Effort normal and breath sounds normal.  Abdominal: Soft. He exhibits no distension. There is no tenderness. There is no guarding.  Musculoskeletal: He exhibits no edema and no tenderness.  Neurological: He is alert and oriented to person, place, and time. He has normal strength. No cranial nerve deficit. GCS eye subscore is 4. GCS verbal subscore is 5. GCS motor subscore is 6.  Do drift or droop  Skin: Skin is warm. No rash noted.  Psychiatric: He has a normal mood and affect.    ED Course  Procedures (including critical care time) Labs Review Labs Reviewed  COMPREHENSIVE METABOLIC PANEL - Abnormal; Notable for the following:    Potassium 3.4 (*)    GFR calc non Af Amer 74 (*)    GFR calc Af Amer 86 (*)    All other components within normal limits  CBC  TROPONIN I  TROPONIN I  CBC  BASIC METABOLIC PANEL  TROPONIN I  TROPONIN I  HEPARIN LEVEL (UNFRACTIONATED)   Imaging Review Dg Chest 2 View  02/08/2013   CLINICAL DATA:  Chest pain  EXAM: CHEST  2 VIEW  COMPARISON:  None.  FINDINGS: Mild elevation of the left hemidiaphragm is noted. There is mild left base atelectasis. There is mild patchy infiltrate in the right base. Elsewhere lungs are clear. Heart size and pulmonary vascularity are normal. No adenopathy. No pneumothorax. There is evidence of an old fracture of the right clavicle, healed.  IMPRESSION: Area of patchy infiltrate right base.  Mild left base atelectasis.   Electronically Signed   By: Bretta Bang M.D.   On: 02/08/2013 18:52    EKG Interpretation    Date/Time:  Monday February 08 2013 20:39:39 EST Ventricular Rate:  53 PR Interval:  180 QRS Duration: 95 QT Interval:  450 QTC Calculation: 422 R Axis:   21 Text Interpretation:  Sinus rhythm Probable lateral infarct, old Confirmed by Jaser Fullen  MD, Lovett Coffin ([id number]) on 02/08/2013 11:11:49 PM          Repeat EKG done in MUSE   Date:  02/08/2013  Rate: 53  Rhythm: normal sinus rhythm  QRS Axis: normal  Intervals: normal  ST/T Wave abnormalities: q waves lateral  Conduction Disutrbances:none  Narrative Interpretation:   Old EKG Reviewed: changes noted     MDM   1. CAP (community acquired pneumonia)   2. Syncope   3. Acute chest pain   4. Abnormal EKG   5. ACS (acute coronary syndrome)    With risk factors, diaphoresis,  abnormal ekg (q waves inf/ lateral concern for CAD. Pt had asa, cp free.  Plan for admission to tele. Recheck, no cp.   CXR showed possible pneumonia, low suspicion, po levaquin given.  Spoke with [facility] for [facility]. The patients results and plan were reviewed and discussed.   Any x-rays performed were personally reviewed by myself.   Differential diagnosis were considered with the presenting HPI.  Diagnosis: above  EKG: reviewed, q waves; repeat ekg no acute stemi  Admission/ observation were discussed with the admitting physician, patient and/or family and they are comfortable with the plan.      Enid SkeensJoshua M Maziah Smola, MD 02/08/13 [phone]

## 2013-02-08 NOTE — Progress Notes (Signed)
ANTICOAGULATION CONSULT NOTE - Initial Consult  Pharmacy Consult:  Heparin Indication: chest pain/ACS  No Known Allergies  Patient Measurements: Height: 5' 4.96" (165 cm) Weight: 168 lb 14 oz (76.6 kg) IBW/kg (Calculated) : 61.41 Heparin Dosing Weight: 77 kg  Vital Signs: Temp: 97.5 F (36.4 C) (01/05 1735) Temp src: Oral (01/05 1735) BP: 154/92 mmHg (01/05 2045) Pulse Rate: 53 (01/05 2045)  Labs:  Recent Labs  02/08/13 1735  HGB 15.3  HCT 44.0  PLT 215  CREATININE 1.08  TROPONINI <0.30    Estimated Creatinine Clearance: 71.2 ml/min (by C-G formula based on Cr of 1.08).   Medical History: Past Medical History  Diagnosis Date  . Arthritis   . Hyperlipidemia       Assessment: [age] YOM with severe chest pain and diaphoresis last night who passed out after having a bowel movement.  Chest pain returned this AM and [facility]  [facility] consulted to mange IV heparin for ACS.  Patient is not on anticoagulation PTA.  Baseline labs reviewed.   Goal of Therapy:  Heparin level 0.3-0.7 units/ml Monitor platelets by anticoagulation protocol: Yes    Plan:  - Heparin 3500 units IV bolus x 1, then - Heparin gtt at 950 units/hr - Check 6 hr HL - Daily HL / CBC - F/U KCL supplementation    Magdelena Kinsella D. Laney Potashang, PharmD, BCPS Pager:  [phone] 02/08/2013, 9:08 PM

## 2013-02-08 NOTE — Progress Notes (Signed)
   Subjective:    Patient ID: Frank Gill, male    DOB: 03/08/1955, 58 y.o.   MRN: [id number]  HPI This 58 y.o. male presents for evaluation of chest pain and shortness of breath.  He developed chest pain that was severe and had diaphoresis last night and he passed out after having bm.  His wife who accompanies him wanted him to go to the hospital but he refused. He developed chest pain again this morning c/o mild discomfort at present.   Review of Systems    C/o chest pain and SOB Denies HA, dizziness, vision change, N/V, diarrhea, constipation, dysuria, urinary urgency or frequency, myalgias, arthralgias or rash.  Objective:   Physical Exam  Vital signs noted  Well developed well nourished male.  HEENT - Head atraumatic Normocephalic                Eyes - PERRLA, Conjuctiva - clear Sclera- Clear EOMI                Ears - EAC's Wnl TM's Wnl Gross Hearing WNL                Throat - oropharanx wnl Respiratory - Lungs CTA bilateral Cardiac - RRR S1 and S2 without murmur GI - Abdomen soft Nontender and bowel sounds active x 4 Extremities - No edema. Neuro - Grossly intact.  EKG - NSR with Q waves in inferior lateral leads     Assessment & Plan:  Syncope - Plan: EKG 12-Lead  Chest pain - Plan: EKG 12-Lead  SOB (shortness of breath) - Plan: EKG 12-Lead  Call 911 and will be transported to the ED.  Start Oxygen nasal cannula 2 liters, NTG 0.4mg  SL, And ASA 81mg  x 4 po.  Deatra Canter.Jahzier Villalon J Seabron Iannello FNP

## 2013-02-08 NOTE — H&P (Addendum)
[facility] History and Physical  Frank SafeDwight D Damiani XLK:[id number] DOB: 1955-11-22 DOA: 02/08/2013  Referring physician: EDP PCP: Rudi HeapMOORE, DONALD, MD   Chief Complaint: Chest pain   HPI: Frank Gill is a 58 y.o. male who presents to the ED after an episode of chest pain that occurred earlier today.  Patient first had crushing, left sided, chest pain for a prolonged period of time with diaphoresis, SOB, on Thursday last week.  He also has been having some SOB with activity for quite some time.  He also had a syncopal episode on Thursday and one on Sat.  His wife (who herself has had 3 coronary stents), begged him to come in to the ED but he decided to wait and went to his PCPs office today.  At his PCPs office and EKG demonstrated apparently new Q waves suggestive of an anterior infarct.  911 was called and patient was sent emergently to the ED.  Review of Systems: Systems reviewed.  As above, otherwise negative  Past Medical History  Diagnosis Date  . Arthritis   . Hyperlipidemia    History reviewed. No pertinent past surgical history. Social History:  reports that he has been smoking Cigarettes.  He started smoking about 33 years ago. He has been smoking about 1.00 pack per day. He does not have any smokeless tobacco history on file. He reports that he drinks alcohol. He reports that he does not use illicit drugs.  No Known Allergies  Family History  Problem Relation Age of Onset  . Heart disease Mother   . Cancer Father   . Mental illness Sister      Prior to Admission medications   Medication Sig Start Date End Date Taking? Authorizing Provider  aspirin 81 MG tablet Take 81 mg by mouth daily.   Yes Historical Provider, MD  atorvastatin (LIPITOR) 40 MG tablet Take 40 mg by mouth at bedtime.   Yes Historical Provider, MD  CALCIUM PO Take 1 tablet by mouth daily. Takes 2000mg  every day   Yes Historical Provider, MD  docusate sodium (COLACE) 100 MG capsule Take 100 mg by mouth at  bedtime.   Yes Historical Provider, MD  Fenofibrate (LIPOFEN) 150 MG CAPS Take 1 capsule (150 mg total) by mouth 1 day or 1 dose. 01/04/13  Yes Deatra CanterWilliam J Oxford, FNP  gabapentin (NEURONTIN) 600 MG tablet Take 1 tablet (600 mg total) by mouth 3 (three) times daily. 01/04/13  Yes Deatra CanterWilliam J Oxford, FNP  GARLIC PO Take 1 capsule by mouth daily.   Yes Historical Provider, MD  loratadine (CLARITIN) 10 MG tablet Take 10 mg by mouth daily.   Yes Historical Provider, MD  Multiple Vitamin (MULTIVITAMIN WITH MINERALS) TABS tablet Take 1 tablet by mouth daily.   Yes Historical Provider, MD  naproxen sodium (ANAPROX) 550 MG tablet Take 1 tablet (550 mg total) by mouth 2 (two) times daily with a meal. 01/04/13  Yes Deatra CanterWilliam J Oxford, FNP  nitroGLYCERIN (NITROSTAT) 0.4 MG SL tablet Place 0.4 mg under the tongue every 5 (five) minutes as needed for chest pain.   Yes Historical Provider, MD  traMADol (ULTRAM) 50 MG tablet Take 50 mg by mouth every 6 (six) hours as needed for moderate pain. TAKE ONE TABLET BY MOUTH EVERY DAY 01/04/13  Yes Deatra CanterWilliam J Oxford, FNP   Physical Exam: Filed Vitals:   02/08/13 2045  BP: 154/92  Pulse: 53  Temp:   Resp: 10    BP 154/92  Pulse 53  Temp(Src) 97.5 F (36.4 C) (Oral)  Resp 10  Ht 5' 4.96" (1.65 m)  Wt 76.6 kg (168 lb 14 oz)  BMI 28.14 kg/m2  SpO2 97%  General Appearance:    Alert, oriented, no distress, appears stated age  Head:    Normocephalic, atraumatic  Eyes:    PERRL, EOMI, sclera non-icteric        Nose:   Nares without drainage or epistaxis. Mucosa, turbinates normal  Throat:   Moist mucous membranes. Oropharynx without erythema or exudate.  Neck:   Supple. No carotid bruits.  No thyromegaly.  No lymphadenopathy.   Back:     No CVA tenderness, no spinal tenderness  Lungs:     Clear to auscultation bilaterally, without wheezes, rhonchi or rales  Chest wall:    No tenderness to palpitation  Heart:    Regular rate and rhythm without murmurs, gallops, rubs   Abdomen:     Soft, non-tender, nondistended, normal bowel sounds, no organomegaly  Genitalia:    deferred  Rectal:    deferred  Extremities:   No clubbing, cyanosis or edema.  Pulses:   2+ and symmetric all extremities  Skin:   Skin color, texture, turgor normal, no rashes or lesions  Lymph nodes:   Cervical, supraclavicular, and axillary nodes normal  Neurologic:   CNII-XII intact. Normal strength, sensation and reflexes      throughout    Labs on Admission:  Basic Metabolic Panel:  Recent Labs Lab 02/08/13 1735  NA 143  K 3.4*  CL 104  CO2 27  GLUCOSE 89  BUN 15  CREATININE 1.08  CALCIUM 8.9   Liver Function Tests:  Recent Labs Lab 02/08/13 1735  AST 26  ALT 21  ALKPHOS 44  BILITOT 0.3  PROT 6.9  ALBUMIN 4.3   No results found for this basename: LIPASE, AMYLASE,  in the last 168 hours No results found for this basename: AMMONIA,  in the last 168 hours CBC:  Recent Labs Lab 02/08/13 1735  WBC 6.4  HGB 15.3  HCT 44.0  MCV 94.8  PLT 215   Cardiac Enzymes:  Recent Labs Lab 02/08/13 1735  TROPONINI <0.30    BNP (last 3 results) No results found for this basename: PROBNP,  in the last 8760 hours CBG: No results found for this basename: GLUCAP,  in the last 168 hours  Radiological Exams on Admission: Dg Chest 2 View  02/08/2013   CLINICAL DATA:  Chest pain  EXAM: CHEST  2 VIEW  COMPARISON:  None.  FINDINGS: Mild elevation of the left hemidiaphragm is noted. There is mild left base atelectasis. There is mild patchy infiltrate in the right base. Elsewhere lungs are clear. Heart size and pulmonary vascularity are normal. No adenopathy. No pneumothorax. There is evidence of an old fracture of the right clavicle, healed.  IMPRESSION: Area of patchy infiltrate right base.  Mild left base atelectasis.   Electronically Signed   By: Bretta Bang M.D.   On: 02/08/2013 18:52    EKG: Independently reviewed.  Assessment/Plan Principal Problem:   ACS (acute  coronary syndrome) Active Problems:   Syncope   1. ACS with Q waves - suggestive of "old" MI, probably had MI on Thursday of last week.  Admitting to tele, heparin gtt, cards consult in AM, 2d echo ordered, given ongoing mild SOB suspect some wall motion abnormality may be visible.  Given no H/O cardiac problems and obvious Q wave findings on EKG as well as a  HEART score of 7 (no repol disturbance but giving him 2 points for the new Q wave findings), I suspect that the patient likely will end up needing heart cath, keeping NPO after midnight. 2. Infiltrate on CXR - despite the questionable small infiltrate on CXR patient has been having no cough, no fever, no WBC, no other signs or symptoms of PNA so will hold off on PNA treatment at this time.    Code Status: Full  Family Communication: Wife at bedside Disposition Plan: Admit to inpatient   Time spent: 70 min  GARDNER, JARED M. [facility] Pager [phone]  If 7AM-7PM, please contact the day team taking care of the patient [email or web address] Password [email or web address] 02/08/2013, 9:06 PM

## 2013-02-09 ENCOUNTER — Encounter (HOSPITAL_COMMUNITY): Payer: Self-pay | Admitting: *Deleted

## 2013-02-09 ENCOUNTER — Encounter (HOSPITAL_COMMUNITY)
Admission: EM | Disposition: A | Discharge status: Home / Self Care | Payer: Self-pay | Source: Home / Self Care | Attending: Internal Medicine

## 2013-02-09 ENCOUNTER — Inpatient Hospital Stay (HOSPITAL_COMMUNITY): Payer: Medicare HMO

## 2013-02-09 DIAGNOSIS — R0602 Shortness of breath: Secondary | ICD-10-CM | POA: Diagnosis present

## 2013-02-09 DIAGNOSIS — F17200 Nicotine dependence, unspecified, uncomplicated: Secondary | ICD-10-CM

## 2013-02-09 DIAGNOSIS — R9431 Abnormal electrocardiogram [ECG] [EKG]: Secondary | ICD-10-CM

## 2013-02-09 DIAGNOSIS — E785 Hyperlipidemia, unspecified: Secondary | ICD-10-CM | POA: Diagnosis present

## 2013-02-09 DIAGNOSIS — I200 Unstable angina: Principal | ICD-10-CM

## 2013-02-09 DIAGNOSIS — Z720 Tobacco use: Secondary | ICD-10-CM | POA: Diagnosis present

## 2013-02-09 HISTORY — PX: LEFT HEART CATHETERIZATION WITH CORONARY ANGIOGRAM: SHX5451

## 2013-02-09 LAB — CBC
HCT: 43.8 % (ref 39.0–52.0)
Hemoglobin: 15.2 g/dL (ref 13.0–17.0)
MCH: 32.8 pg (ref 26.0–34.0)
MCHC: 34.7 g/dL (ref 30.0–36.0)
MCV: 94.4 fL (ref 78.0–100.0)
PLATELETS: 220 10*3/uL (ref 150–400)
RBC: 4.64 MIL/uL (ref 4.22–5.81)
RDW: 14.2 % (ref 11.5–15.5)
WBC: 6.4 10*3/uL (ref 4.0–10.5)

## 2013-02-09 LAB — PROTIME-INR
INR: 1.05 (ref 0.00–1.49)
PROTHROMBIN TIME: 13.5 s (ref 11.6–15.2)

## 2013-02-09 LAB — HEPARIN LEVEL (UNFRACTIONATED): Heparin Unfractionated: 0.13 IU/mL — ABNORMAL LOW (ref 0.30–0.70)

## 2013-02-09 LAB — BASIC METABOLIC PANEL
BUN: 14 mg/dL (ref 6–23)
CALCIUM: 8.6 mg/dL (ref 8.4–10.5)
CO2: 26 mEq/L (ref 19–32)
Chloride: 102 mEq/L (ref 96–112)
Creatinine, Ser: 1.07 mg/dL (ref 0.50–1.35)
GFR calc Af Amer: 87 mL/min — ABNORMAL LOW (ref >90)
GFR calc non Af Amer: 75 mL/min — ABNORMAL LOW (ref >90)
Glucose, Bld: 97 mg/dL (ref 70–99)
Potassium: 3.3 mEq/L — ABNORMAL LOW (ref 3.7–5.3)
SODIUM: 141 meq/L (ref 137–147)

## 2013-02-09 LAB — TROPONIN I
Troponin I: <0.3 ng/mL (ref <0.30)
Troponin I: <0.3 ng/mL (ref <0.30)

## 2013-02-09 SURGERY — LEFT HEART CATHETERIZATION WITH CORONARY ANGIOGRAM
Anesthesia: LOCAL

## 2013-02-09 MED ORDER — HEPARIN (PORCINE) IN NACL 2-0.9 UNIT/ML-% IJ SOLN
INTRAMUSCULAR | Status: AC
  Filled 2013-02-09: qty 1000

## 2013-02-09 MED ORDER — FENTANYL CITRATE 0.05 MG/ML IJ SOLN
INTRAMUSCULAR | Status: AC
  Filled 2013-02-09: qty 2

## 2013-02-09 MED ORDER — SODIUM CHLORIDE 0.9 % IV SOLN: 250.0000 mL | INTRAVENOUS | Status: DC | PRN

## 2013-02-09 MED ORDER — SODIUM CHLORIDE 0.9 % IV SOLN: 1.0000 mL/kg/h | INTRAVENOUS | Status: DC

## 2013-02-09 MED ORDER — MIDAZOLAM HCL 2 MG/2ML IJ SOLN
INTRAMUSCULAR | Status: AC
  Filled 2013-02-09: qty 2

## 2013-02-09 MED ORDER — SODIUM CHLORIDE 0.9 % IJ SOLN: 3.0000 mL | Freq: Two times a day (BID) | INTRAMUSCULAR | Status: DC

## 2013-02-09 MED ORDER — NITROGLYCERIN 0.2 MG/ML ON CALL CATH LAB
INTRAVENOUS | Status: AC
  Filled 2013-02-09: qty 1

## 2013-02-09 MED ORDER — SODIUM CHLORIDE 0.9 % IJ SOLN: 3.0000 mL | INTRAMUSCULAR | Status: DC | PRN

## 2013-02-09 MED ORDER — ASPIRIN 81 MG PO CHEW
81.0000 mg | CHEWABLE_TABLET | ORAL | Status: AC
  Administered 2013-02-09: 81 mg via ORAL
  Filled 2013-02-09: qty 1

## 2013-02-09 MED ORDER — VERAPAMIL HCL 2.5 MG/ML IV SOLN
INTRAVENOUS | Status: AC
  Filled 2013-02-09: qty 2

## 2013-02-09 MED ORDER — HEPARIN BOLUS VIA INFUSION
2000.0000 [IU] | Freq: Once | INTRAVENOUS | Status: AC
Start: 2013-02-09 — End: 2013-02-09
  Administered 2013-02-09: 2000 [IU] via INTRAVENOUS
  Filled 2013-02-09: qty 2000

## 2013-02-09 MED ORDER — LIDOCAINE HCL (PF) 1 % IJ SOLN
INTRAMUSCULAR | Status: AC
  Filled 2013-02-09: qty 30

## 2013-02-09 MED ORDER — SODIUM CHLORIDE 0.9 % IV SOLN: INTRAVENOUS | Status: DC

## 2013-02-09 MED ORDER — HEPARIN SODIUM (PORCINE) 1000 UNIT/ML IJ SOLN
INTRAMUSCULAR | Status: AC
  Filled 2013-02-09: qty 1

## 2013-02-09 MED ORDER — POTASSIUM CHLORIDE CRYS ER 20 MEQ PO TBCR
40.0000 meq | EXTENDED_RELEASE_TABLET | Freq: Once | ORAL | Status: AC
  Administered 2013-02-09: 40 meq via ORAL
  Filled 2013-02-09: qty 2

## 2013-02-09 NOTE — Discharge Instructions (Signed)
Chest Pain Observation It is often hard to give a specific diagnosis for the cause of chest pain. Among other possibilities your symptoms might be caused by inadequate oxygen delivery to your heart (angina). Angina that is not treated or evaluated can lead to a heart attack (myocardial infarction) or death. Blood tests, electrocardiograms, and X-rays may have been done to help determine a possible cause of your chest pain. After evaluation and observation, your health care provider has determined that it is unlikely your pain was caused by an unstable condition that requires hospitalization. However, a full evaluation of your pain may need to be completed, with additional diagnostic testing as directed. It is very important to keep your follow-up appointments. Not keeping your follow-up appointments could result in permanent heart damage, disability, or death. If there is any problem keeping your follow-up appointments, you must call your health care provider. HOME CARE INSTRUCTIONS  Due to the slight chance that your pain could be angina, it is important to follow your health care provider's treatment plan and also maintain a healthy lifestyle:  Maintain or work toward achieving a healthy weight.  Stay physically active and exercise regularly.  Decrease your salt intake.  Eat a balanced, healthy diet. Talk to a dietician to learn about heart healthy foods.  Increase your fiber intake by including whole grains, vegetables, fruits, and nuts in your diet.  Avoid situations that cause stress, anger, or depression.  Take medicines as advised by your health care provider. Report any side effects to your health care provider. Do not stop medicines or adjust the dosages on your own.  Quit smoking. Do not use nicotine patches or gum until you check with your health care provider.  Keep your blood pressure, blood sugar, and cholesterol levels within normal limits.  Limit alcohol intake to no more than  1 drink per day for women that are not pregnant and 2 drinks per day for men.  Do not abuse drugs. SEEK IMMEDIATE MEDICAL CARE IF: You have severe chest pain or pressure which may include symptoms such as:  You feel pain or pressure in you arms, neck, jaw, or back.  You have severe back or abdominal pain, feel sick to your stomach (nauseous), or throw up (vomit).  You are sweating profusely.  You are having a fast or irregular heartbeat.  You feel short of breath while at rest.  You notice increasing shortness of breath during rest, sleep, or with activity.  You have chest pain that does not get better after rest or after taking your usual medicine.  You wake from sleep with chest pain.  You are unable to sleep because you cannot breathe.  You develop a frequent cough or you are coughing up blood.  You feel dizzy, faint, or experience extreme fatigue.  You develop severe weakness, dizziness, fainting, or chills. Any of these symptoms may represent a serious problem that is an emergency. Do not wait to see if the symptoms will go away. Call your local emergency services (911 in the [place] Do not drive yourself to the hospital. MAKE SURE YOU:  Understand these instructions.  Will watch your condition.  Will get help right away if you are not doing well or get worse. Document Released: 02/23/2010 Document Revised: 09/23/2012 Document Reviewed: 07/23/2012 [facility] Patient Information 2014 [place], [place].  Cholesterol Cholesterol is a type of fat. Your body needs a small amount of cholesterol, but too much can cause health problems. Certain problems include heart attacks, strokes,  and not enough blood flow to your heart, brain, kidneys, or feet. You get cholesterol in 2 ways:  Naturally.  By eating certain foods. HOME CARE  Eat a low-fat diet:  Eat less eggs, whole dairy products (whole milk, cheese, and butter), fatty meats, and fried foods.  Eat more fruits,  vegetables, whole-wheat breads, lean chicken, and fish.  Follow your exercise program as told by your doctor.  Keep your weight at a healthy level. Talk to your doctor about what is right for you.  Only take medicine as told by your doctor.  Get your cholesterol checked once a year or as told by your doctor. MAKE SURE YOU:  Understand these instructions.  Will watch your condition.  Will get help right away if you are not doing well or get worse. Document Released: 04/19/2008 Document Revised: 04/15/2011 Document Reviewed: 04/19/2008 [facility] Patient Information 2014 [place], [place].  Radial Site Care Refer to this sheet in the next few weeks. These instructions provide you with information on caring for yourself after your procedure. Your caregiver may also give you more specific instructions. Your treatment has been planned according to current medical practices, but problems sometimes occur. Call your caregiver if you have any problems or questions after your procedure. HOME CARE INSTRUCTIONS  You may shower the day after the procedure.Remove the bandage (dressing) and gently wash the site with plain soap and water.Gently pat the site dry.  Do not apply powder or lotion to the site.  Do not submerge the affected site in water for 3 to 5 days.  Inspect the site at least twice daily.  Do not flex or bend the affected arm for 24 hours.  No lifting over 5 pounds (2.3 kg) for 5 days after your procedure.  Do not drive home if you are discharged the same day of the procedure. Have someone else drive you.  You may drive 24 hours after the procedure unless otherwise instructed by your caregiver.  Do not operate machinery or power tools for 24 hours.  A responsible adult should be with you for the first 24 hours after you arrive home. What to expect:  Any bruising will usually fade within 1 to 2 weeks.  Blood that collects in the tissue (hematoma) may be painful to the  touch. It should usually decrease in size and tenderness within 1 to 2 weeks. SEEK IMMEDIATE MEDICAL CARE IF:  You have unusual pain at the radial site.  You have redness, warmth, swelling, or pain at the radial site.  You have drainage (other than a small amount of blood on the dressing).  You have chills.  You have a fever or persistent symptoms for more than 72 hours.  You have a fever and your symptoms suddenly get worse.  Your arm becomes pale, cool, tingly, or numb.  You have heavy bleeding from the site. Hold pressure on the site. Document Released: 02/23/2010 Document Revised: 04/15/2011 Document Reviewed: 02/23/2010 [facility] Patient Information 2014 [place], [place]. You Can Quit Smoking If you are ready to quit smoking or are thinking about it, congratulations! You have chosen to help yourself be healthier and live longer! There are lots of different ways to quit smoking. Nicotine gum, nicotine patches, a nicotine inhaler, or nicotine nasal spray can help with physical craving. Hypnosis, support groups, and medicines help break the habit of smoking. TIPS TO GET OFF AND STAY OFF CIGARETTES  Learn to predict your moods. Do not let a bad situation be your excuse  to have a cigarette. Some situations in your life might tempt you to have a cigarette.  Ask friends and co-workers not to smoke around you.  Make your home smoke-free.  Never have "just one" cigarette. It leads to wanting another and another. Remind yourself of your decision to quit.  On a card, make a list of your reasons for not smoking. Read it at least the same number of times a day as you have a cigarette. Tell yourself everyday, "I do not want to smoke. I choose not to smoke."  Ask someone at home or work to help you with your plan to quit smoking.  Have something planned after you eat or have a cup of coffee. Take a walk or get other exercise to perk you up. This will help to keep you from overeating.  Try a  relaxation exercise to calm you down and decrease your stress. Remember, you may be tense and nervous the first two weeks after you quit. This will pass.  Find new activities to keep your hands busy. Play with a pen, coin, or rubber band. Doodle or draw things on paper.  Brush your teeth right after eating. This will help cut down the craving for the taste of tobacco after meals. You can try mouthwash too.  Try gum, breath mints, or diet candy to keep something in your mouth. IF YOU SMOKE AND WANT TO QUIT:  Do not stock up on cigarettes. Never buy a carton. Wait until one pack is finished before you buy another.  Never carry cigarettes with you at work or at home.  Keep cigarettes as far away from you as possible. Leave them with someone else.  Never carry matches or a lighter with you.  Ask yourself, "Do I need this cigarette or is this just a reflex?"  Bet with someone that you can quit. Put cigarette money in a piggy bank every morning. If you smoke, you give up the money. If you do not smoke, by the end of the week, you keep the money.  Keep trying. It takes 21 days to change a habit!  Talk to your doctor about using medicines to help you quit. These include nicotine replacement gum, lozenges, or skin patches. Document Released: 11/17/2008 Document Revised: 04/15/2011 Document Reviewed: 11/17/2008 [facility] Patient Information 2014 [place], [place]. Smoking Cessation, Tips for Success YOU CAN QUIT SMOKING If you are ready to quit smoking, congratulations! You have chosen to help yourself be healthier. Cigarettes bring nicotine, tar, carbon monoxide, and other irritants into your body. Your lungs, heart, and blood vessels will be able to work better without these poisons. There are many different ways to quit smoking. Nicotine gum, nicotine patches, a nicotine inhaler, or nicotine nasal spray can help with physical craving. Hypnosis, support groups, and medicines help break the habit of  smoking. Here are some tips to help you quit for good.  Throw away all cigarettes.  Clean and remove all ashtrays from your home, work, and car.  On a card, write down your reasons for quitting. Carry the card with you and read it when you get the urge to smoke.  Cleanse your body of nicotine. Drink enough water and fluids to keep your urine clear or pale yellow. Do this after quitting to flush the nicotine from your body.  Learn to predict your moods. Do not let a bad situation be your excuse to have a cigarette. Some situations in your life might tempt you into wanting a  cigarette.  Never have "just one" cigarette. It leads to wanting another and another. Remind yourself of your decision to quit.  Change habits associated with smoking. If you smoked while driving or when feeling stressed, try other activities to replace smoking. Stand up when drinking your coffee. Brush your teeth after eating. Sit in a different chair when you read the paper. Avoid alcohol while trying to quit, and try to drink fewer caffeinated beverages. Alcohol and caffeine may urge you to smoke.  Avoid foods and drinks that can trigger a desire to smoke, such as sugary or spicy foods and alcohol.  Ask people who smoke not to smoke around you.  Have something planned to do right after eating or having a cup of coffee. Take a walk or exercise to perk you up. This will help to keep you from overeating.  Try a relaxation exercise to calm you down and decrease your stress. Remember, you may be tense and nervous for the first 2 weeks after you quit, but this will pass.  Find new activities to keep your hands busy. Play with a pen, coin, or rubber band. Doodle or draw things on paper.  Brush your teeth right after eating. This will help cut down on the craving for the taste of tobacco after meals. You can try mouthwash, too.  Use oral substitutes, such as lemon drops, carrots, a cinnamon stick, or chewing gum, in place of  cigarettes. Keep them handy so they are available when you have the urge to smoke.  When you have the urge to smoke, try deep breathing.  Designate your home as a nonsmoking area.  If you are a heavy smoker, ask your caregiver about a prescription for nicotine chewing gum. It can ease your withdrawal from nicotine.  Reward yourself. Set aside the cigarette money you save and buy yourself something nice.  Look for support from others. Join a support group or smoking cessation program. Ask someone at home or at work to help you with your plan to quit smoking.  Always ask yourself, "Do I need this cigarette or is this just a reflex?" Tell yourself, "Today, I choose not to smoke," or "I do not want to smoke." You are reminding yourself of your decision to quit, even if you do smoke a cigarette. HOW WILL I FEEL WHEN I QUIT SMOKING?  The benefits of not smoking start within days of quitting.  You may have symptoms of withdrawal because your body is used to nicotine (the addictive substance in cigarettes). You may crave cigarettes, be irritable, feel very hungry, cough often, get headaches, or have difficulty concentrating.  The withdrawal symptoms are only temporary. They are strongest when you first quit but will go away within 10 to 14 days.  When withdrawal symptoms occur, stay in control. Think about your reasons for quitting. Remind yourself that these are signs that your body is healing and getting used to being without cigarettes.  Remember that withdrawal symptoms are easier to treat than the major diseases that smoking can cause.  Even after the withdrawal is over, expect periodic urges to smoke. However, these cravings are generally short-lived and will go away whether you smoke or not. Do not smoke!  If you relapse and smoke again, do not lose hope. Most smokers quit 3 times before they are successful.  If you relapse, do not give up! Plan ahead and think about what you will do the  next time you get the urge to smoke.  LIFE AS A NONSMOKER: MAKE IT FOR A MONTH, MAKE IT FOR LIFE Day 1: Hang this page where you will see it every day. Day 2: Get rid of all ashtrays, matches, and lighters. Day 3: Drink water. Breathe deeply between sips. Day 4: Avoid places with smoke-filled air, such as bars, clubs, or the smoking section of restaurants. Day 5: Keep track of how much money you save by not smoking. Day 6: Avoid boredom. Keep a good book with you or go to the movies. Day 7: Reward yourself! One week without smoking! Day 8: Make a dental appointment to get your teeth cleaned. Day 9: Decide how you will turn down a cigarette before it is offered to you. Day 10: Review your reasons for quitting. Day 11: Distract yourself. Stay active to keep your mind off smoking and to relieve tension. Take a walk, exercise, read a book, do a crossword puzzle, or try a new hobby. Day 12: Exercise. Get off the bus before your stop or use stairs instead of escalators. Day 13: Call on friends for support and encouragement. Day 14: Reward yourself! Two weeks without smoking! Day 15: Practice deep breathing exercises. Day 16: Bet a friend that you can stay a nonsmoker. Day 17: Ask to sit in nonsmoking sections of restaurants. Day 18: Hang up "No Smoking" signs. Day 19: Think of yourself as a nonsmoker. Day 20: Each morning, tell yourself you will not smoke. Day 21: Reward yourself! Three weeks without smoking! Day 22: Think of smoking in negative ways. Remember how it stains your teeth, gives you bad breath, and leaves you short of breath. Day 23: Eat a nutritious breakfast. Day 24:Do not relive your days as a smoker. Day 25: Hold a pencil in your hand when talking on the telephone. Day 26: Tell all your friends you do not smoke. Day 27: Think about how much better food tastes. Day 28: Remember, one cigarette is one too many. Day 29: Take up a hobby that will keep your hands busy. Day 30:  Congratulations! One month without smoking! Give yourself a big reward. Your caregiver can direct you to community resources or hospitals for support, which may include:  Group support.  Education.  Hypnosis.  Subliminal therapy. Document Released: 10/20/2003 Document Revised: 04/15/2011 Document Reviewed: 07/09/2012 [facility] Patient Information 2014 [place], [place].

## 2013-02-09 NOTE — H&P (View-Only) (Signed)
CARDIOLOGY CONSULT NOTE   Patient ID: Frank Gill MRN: [id number] DOB/AGE: [birth year] 59 y.o.  Admit Date: 02/08/2013 Primary Physician: Rudi HeapMOORE, DONALD, MD Primary Cardiologist   Narda RutherfordNew   Reagann Dolce  Clinical Summary Frank Gill is a 58 y.o.male. The patient has risk factors for coronary disease. There is no prior documented disease. He had an episode several days ago with marked diaphoresis and chest discomfort. There was also an episode of presyncope at that time. He is a difficult historian. His wife has known coronary disease and was quite worried about him. He would not agree to be seen today he had chest discomfort. However yesterday, he had return of discomfort. [place] for further evaluation. Troponins are normal. He does have Q waves in leads 1 and aVL. He has been stable here in the hospital. He does smoke. There is hyperlipidemia that is being treated. There is a family history of heart disease.  The patient mentions that in addition he does have some exertional shortness of breath.   No Known Allergies  Medications Scheduled Medications: . aspirin  81 mg Oral Daily  . atorvastatin  40 mg Oral QHS  . docusate sodium  100 mg Oral QHS  . fenofibrate  160 mg Oral Daily  . gabapentin  600 mg Oral TID  . loratadine  10 mg Oral Daily  . multivitamin with minerals  1 tablet Oral Daily  . sodium chloride  3 mL Intravenous Q12H     Infusions: . heparin 1,200 Units/hr (02/09/13 0649)     PRN Medications:  nitroGLYCERIN, traMADol   Past Medical History  Diagnosis Date  . Arthritis   . Hyperlipidemia   . Shortness of breath   . Constipation   . Left sided sciatica     History reviewed. No pertinent past surgical history.  Family History  Problem Relation Age of Onset  . Heart disease Mother   . Cancer Father   . Mental illness Sister     Social History Frank Gill reports that he has been smoking Cigars.  He started  smoking about 33 years ago. He does not have any smokeless tobacco history on file. Frank Gill reports that he drinks alcohol.  Review of Systems  Patient denies fever, chills, headache, sweats, rash, change in vision, change in hearing, nausea vomiting, urinary symptoms. He does have some shortness of breath.  Physical Examination Blood pressure 154/92, pulse 56, temperature 98.1 F (36.7 C), temperature source Oral, resp. rate 18, height 5\' 5"  (1.651 m), weight 169 lb 5 oz (76.8 kg), SpO2 98.00%. Patient is oriented to person time and place. Affect is normal. His wife is in the room. There is no jugulovenous distention. Lungs are clear. Respiratory effort is nonlabored. Cardiac exam reveals S1 and S2. There no clicks or significant murmurs. The abdomen is soft. There's no peripheral edema. There no musculoskeletal deformities. There are no skin rashes.    Intake/Output Summary (Last 24 hours) at 02/09/13 0759 Last data filed at 02/09/13 0649  Gross per 24 hour  Intake      3 ml  Output    575 ml  Net   -572 ml    Telemetry:   I have reviewed telemetry today. There is normal sinus rhythm.  Prior Cardiac Testing/Procedures  Lab Results  Basic Metabolic Panel:  Recent Labs Lab 02/08/13 1735 02/09/13 0600  NA 143 141  K 3.4* 3.3*  CL 104 102  CO2 27 26  GLUCOSE 89 97  BUN 15 14  CREATININE 1.08 1.07  CALCIUM 8.9 8.6    Liver Function Tests:  Recent Labs Lab 02/08/13 1735  AST 26  ALT 21  ALKPHOS 44  BILITOT 0.3  PROT 6.9  ALBUMIN 4.3    CBC:  Recent Labs Lab 02/08/13 1735 02/09/13 0600  WBC 6.4 6.4  HGB 15.3 15.2  HCT 44.0 43.8  MCV 94.8 94.4  PLT 215 220    Cardiac Enzymes:  Recent Labs Lab 02/08/13 1735 02/08/13 2114 02/09/13 0600  TROPONINI <0.30 <0.30 <0.30    BNP: No components found with this basename: POCBNP,    Radiology: Dg Chest 2 View  02/08/2013   CLINICAL DATA:  Chest pain  EXAM: CHEST  2 VIEW  COMPARISON:  None.  FINDINGS:  Mild elevation of the left hemidiaphragm is noted. There is mild left base atelectasis. There is mild patchy infiltrate in the right base. Elsewhere lungs are clear. Heart size and pulmonary vascularity are normal. No adenopathy. No pneumothorax. There is evidence of an old fracture of the right clavicle, healed.  IMPRESSION: Area of patchy infiltrate right base.  Mild left base atelectasis.   Electronically Signed   By: Bretta Bang M.Gill.   On: 02/08/2013 18:52     ECG:   EKG reveals no acute changes. There are Q waves in leads 1 and aVL.   Impression and Recommendations    ACS (acute coronary syndrome)    The patient's presenting story is concerning for ischemic heart disease. His troponins are normal. However his worst symptoms were last week. I feel that we'll be most prudent to proceed with cardiac catheterization. I discussed this with the patient and his wife at length. They both agreed. However the patient has said that he wants his catheterization done by the radial approach. He says that he is not willing to proceed if it is to be done from his groin. I have made plans to arrange for the doctor who will do the catheterization to talk to the patient had a time to finalize the approach.    Syncope     Plan to continue to monitor the patient while he see here    Tobacco abuse      I have counseled the patient to stop smoking. I also counseled his wife.    Dyslipidemia    Patient is being treated.    Exertional shortness of breath     First we will have to rule out significant coronary disease. He needs to stop smoking. He does have a mild abnormality on his chest x-ray that we'll need followup.    Abnormal chest x-ray    There is questionable mild infiltrate on his chest x-ray. The primary care team is assessing this Foley.  I've spoken with the cath lab and made all arrangements. The patient is for cath later this morning.   Jerral Bonito, MD 02/09/2013, 7:59 AM

## 2013-02-09 NOTE — Progress Notes (Signed)
D/c orders received;IV removed with gauze on, pt remains in stable condition, pt meds and instructions reviewed and given to pt; pt d/c to home 

## 2013-02-09 NOTE — Consult Note (Addendum)
CARDIOLOGY CONSULT NOTE   Patient ID: Frank Gill MRN: [id number] DOB/AGE: [birth year] 59 y.o.  Admit Date: 02/08/2013 Primary Physician: Rudi HeapMOORE, DONALD, MD Primary Cardiologist   Narda RutherfordNew   Cindra Austad  Clinical Summary [name] is a 58 y.o.male. The patient has risk factors for coronary disease. There is no prior documented disease. He had an episode several days ago with marked diaphoresis and chest discomfort. There was also an episode of presyncope at that time. He is a difficult historian. His wife has known coronary disease and was quite worried about him. He would not agree to be seen today he had chest discomfort. However yesterday, he had return of discomfort. [place] and was transferred for further evaluation. Troponins are normal. He does have Q waves in leads 1 and aVL. He has been stable here in the hospital. He does smoke. There is hyperlipidemia that is being treated. There is a family history of heart disease.  The patient mentions that in addition he does have some exertional shortness of breath.   No Known Allergies  Medications Scheduled Medications: . aspirin  81 mg Oral Daily  . atorvastatin  40 mg Oral QHS  . docusate sodium  100 mg Oral QHS  . fenofibrate  160 mg Oral Daily  . gabapentin  600 mg Oral TID  . loratadine  10 mg Oral Daily  . multivitamin with minerals  1 tablet Oral Daily  . sodium chloride  3 mL Intravenous Q12H     Infusions: . heparin 1,200 Units/hr (02/09/13 0649)     PRN Medications:  nitroGLYCERIN, traMADol   Past Medical History  Diagnosis Date  . Arthritis   . Hyperlipidemia   . Shortness of breath   . Constipation   . Left sided sciatica     History reviewed. No pertinent past surgical history.  Family History  Problem Relation Age of Onset  . Heart disease Mother   . Cancer Father   . Mental illness Sister     Social History [name] reports that he has been smoking Cigars.  He started  smoking about 33 years ago. He does not have any smokeless tobacco history on file. [name] reports that he drinks alcohol.  Review of Systems  Patient denies fever, chills, headache, sweats, rash, change in vision, change in hearing, nausea vomiting, urinary symptoms. He does have some shortness of breath.  Physical Examination Blood pressure 154/92, pulse 56, temperature 98.1 F (36.7 C), temperature source Oral, resp. rate 18, height 5\' 5"  (1.651 m), weight 169 lb 5 oz (76.8 kg), SpO2 98.00%. Patient is oriented to person time and place. Affect is normal. His wife is in the room. There is no jugulovenous distention. Lungs are clear. Respiratory effort is nonlabored. Cardiac exam reveals S1 and S2. There no clicks or significant murmurs. The abdomen is soft. There's no peripheral edema. There no musculoskeletal deformities. There are no skin rashes.    Intake/Output Summary (Last 24 hours) at 02/09/13 0759 Last data filed at 02/09/13 0649  Gross per 24 hour  Intake      3 ml  Output    575 ml  Net   -572 ml    Telemetry:   I have reviewed telemetry today. There is normal sinus rhythm.  Prior Cardiac Testing/Procedures  Lab Results  Basic Metabolic Panel:  Recent Labs Lab 02/08/13 1735 02/09/13 0600  NA 143 141  K 3.4* 3.3*  CL 104 102  CO2 27 26  GLUCOSE 89 97  BUN 15 14  CREATININE 1.08 1.07  CALCIUM 8.9 8.6    Liver Function Tests:  Recent Labs Lab 02/08/13 1735  AST 26  ALT 21  ALKPHOS 44  BILITOT 0.3  PROT 6.9  ALBUMIN 4.3    CBC:  Recent Labs Lab 02/08/13 1735 02/09/13 0600  WBC 6.4 6.4  HGB 15.3 15.2  HCT 44.0 43.8  MCV 94.8 94.4  PLT 215 220    Cardiac Enzymes:  Recent Labs Lab 02/08/13 1735 02/08/13 2114 02/09/13 0600  TROPONINI <0.30 <0.30 <0.30    BNP: No components found with this basename: POCBNP,    Radiology: Dg Chest 2 View  02/08/2013   CLINICAL DATA:  Chest pain  EXAM: CHEST  2 VIEW  COMPARISON:  None.  FINDINGS:  Mild elevation of the left hemidiaphragm is noted. There is mild left base atelectasis. There is mild patchy infiltrate in the right base. Elsewhere lungs are clear. Heart size and pulmonary vascularity are normal. No adenopathy. No pneumothorax. There is evidence of an old fracture of the right clavicle, healed.  IMPRESSION: Area of patchy infiltrate right base.  Mild left base atelectasis.   Electronically Signed   By: Bretta Bang M.D.   On: 02/08/2013 18:52     ECG:   EKG reveals no acute changes. There are Q waves in leads 1 and aVL.   Impression and Recommendations    ACS (acute coronary syndrome)    The patient's presenting story is concerning for ischemic heart disease. His troponins are normal. However his worst symptoms were last week. I feel that we'll be most prudent to proceed with cardiac catheterization. I discussed this with the patient and his wife at length. They both agreed. However the patient has said that he wants his catheterization done by the radial approach. He says that he is not willing to proceed if it is to be done from his groin. I have made plans to arrange for the doctor who will do the catheterization to talk to the patient had a time to finalize the approach.    Syncope     Plan to continue to monitor the patient while he see here    Tobacco abuse      I have counseled the patient to stop smoking. I also counseled his wife.    Dyslipidemia    Patient is being treated.    Exertional shortness of breath     First we will have to rule out significant coronary disease. He needs to stop smoking. He does have a mild abnormality on his chest x-ray that we'll need followup.    Abnormal chest x-ray    There is questionable mild infiltrate on his chest x-ray. The primary care team is assessing this Foley.  I've spoken with the cath lab and made all arrangements. The patient is for cath later this morning.   Jerral Bonito, MD 02/09/2013, 7:59 AM

## 2013-02-09 NOTE — Progress Notes (Signed)
ANTICOAGULATION CONSULT NOTE - Follow Up Consult  Pharmacy Consult for heparin Indication: chest pain/ACS  Labs:  Recent Labs  02/08/13 1735 02/08/13 2114 02/09/13 0600  HGB 15.3  --  15.2  HCT 44.0  --  43.8  PLT 215  --  220  HEPARINUNFRC  --   --  0.13*  CREATININE 1.08  --   --   TROPONINI <0.30 <0.30  --     Assessment: 57yo male subtherapeutic on heparin with initial dosing for ACS.  Goal of Therapy:  Heparin level 0.3-0.7 units/ml   Plan:  Will rebolus with heparin 2000 units x1 and increase gtt by 3 units/kg/hr to 1200 units/hr and check level in 6hr.  Vernard GamblesVeronda Iyani Dresner, PharmD, BCPS  02/09/2013,6:44 AM

## 2013-02-09 NOTE — Progress Notes (Signed)
[facility] PROGRESS NOTE  Lyndel SafeDwight D Flood ZOX:[id number] DOB: 1955-07-13 DOA: 02/08/2013 PCP: Rudi HeapMOORE, DONALD, MD  Assessment/Plan: ACS (acute coronary syndrome)  Appreciate cardiology consultation and assistance.  Pt is for cath study this morning.  His troponins are normal.   Syncope  Plan to continue to monitor Cardiology workup in progress  Tobacco abuse  The patient was counseled on the dangers of tobacco use, and was advised to quit.  Reviewed strategies to maximize success, including removing cigarettes and smoking materials from environment and stress management.  Hypokalemia - replace - check Mg - follow   Dyslipidemia  Continue statin therapy  Exertional shortness of breath  Counseled on tobacco cessation  Abnormal chest x-ray  Repeat CXR  No s/s of infection at this time Incentive spirometry  Code Status: FULL Family Communication: wife at bedside  HPI/Subjective: Pt denies complaints at this time  Objective: Filed Vitals:   02/09/13 0555  BP: 154/92  Pulse: 56  Temp: 98.1 F (36.7 C)  Resp: 18    Intake/Output Summary (Last 24 hours) at 02/09/13 1043 Last data filed at 02/09/13 0649  Gross per 24 hour  Intake      3 ml  Output    575 ml  Net   -572 ml   Filed Weights   02/08/13 2100 02/09/13 0024  Weight: 168 lb 14 oz (76.6 kg) 169 lb 5 oz (76.8 kg)    Exam:   General:  Awake, alert, no distress  Cardiovascular: normal s1, s2 sounds   Respiratory: BBS clear to ausculation   Abdomen: soft, nondistended, nontender  Musculoskeletal: no cyanosis or pretibial edema   Data Reviewed: Basic Metabolic Panel:  Recent Labs Lab 02/08/13 1735 02/09/13 0600  NA 143 141  K 3.4* 3.3*  CL 104 102  CO2 27 26  GLUCOSE 89 97  BUN 15 14  CREATININE 1.08 1.07  CALCIUM 8.9 8.6   Liver Function Tests:  Recent Labs Lab 02/08/13 1735  AST 26  ALT 21  ALKPHOS 44  BILITOT 0.3  PROT 6.9  ALBUMIN 4.3   No results found for this  basename: LIPASE, AMYLASE,  in the last 168 hours No results found for this basename: AMMONIA,  in the last 168 hours CBC:  Recent Labs Lab 02/08/13 1735 02/09/13 0600  WBC 6.4 6.4  HGB 15.3 15.2  HCT 44.0 43.8  MCV 94.8 94.4  PLT 215 220   Cardiac Enzymes:  Recent Labs Lab 02/08/13 1735 02/08/13 2114 02/09/13 0600 02/09/13 0855  TROPONINI <0.30 <0.30 <0.30 <0.30   BNP (last 3 results) No results found for this basename: PROBNP,  in the last 8760 hours CBG: No results found for this basename: GLUCAP,  in the last 168 hours  No results found for this or any previous visit (from the past 240 hour(s)).   Studies: Dg Chest 2 View  02/08/2013   CLINICAL DATA:  Chest pain  EXAM: CHEST  2 VIEW  COMPARISON:  None.  FINDINGS: Mild elevation of the left hemidiaphragm is noted. There is mild left base atelectasis. There is mild patchy infiltrate in the right base. Elsewhere lungs are clear. Heart size and pulmonary vascularity are normal. No adenopathy. No pneumothorax. There is evidence of an old fracture of the right clavicle, healed.  IMPRESSION: Area of patchy infiltrate right base.  Mild left base atelectasis.   Electronically Signed   By: Bretta BangWilliam  Woodruff M.D.   On: 02/08/2013 18:52    Scheduled Meds: . [facility] HOLD]  aspirin  81 mg Oral Daily  . [MAR HOLD] atorvastatin  40 mg Oral QHS  . [MAR HOLD] docusate sodium  100 mg Oral QHS  . [facility] HOLD] fenofibrate  160 mg Oral Daily  . [facility] HOLD] gabapentin  600 mg Oral TID  . [facility] HOLD] loratadine  10 mg Oral Daily  . [MAR HOLD] multivitamin with minerals  1 tablet Oral Daily  . [facility] HOLD] sodium chloride  3 mL Intravenous Q12H  . sodium chloride  3 mL Intravenous Q12H   Continuous Infusions: . sodium chloride 75 mL/hr at 02/09/13 0930  . heparin 1,200 Units/hr (02/09/13 0649)    Principal Problem:   ACS (acute coronary syndrome) Active Problems:   Syncope   Tobacco abuse   Dyslipidemia   Exertional shortness of breath    Abnormal chest x-ray   Lamya Lausch [facility]  [facility] Pager [phone]. If 7PM-7AM, please contact night-coverage at [email or web address], password [facility] 02/09/2013, 10:43 AM  LOS: 1 day

## 2013-02-09 NOTE — CV Procedure (Signed)
CARDIAC CATHETERIZATION REPORT  NAME:  Frank Gill   MRN: [id number] DOB:  12/31/1955   ADMIT DATE: 02/08/2013 Procedure Date: 02/09/2013  INTERVENTIONAL CARDIOLOGIST: Leonie Man, M.D., MS PRIMARY CARE PROVIDER: Redge Gainer, MD PRIMARY CARDIOLOGIST: Dola Argyle, MD  PATIENT:  Frank Gill is a 58 y.o. male with multiple cardiac risk factors including hypertension, hyperlipidemia and chronic smoker. He also has significant amount family members with cardiac disease. He himself has no prior documented coronary disease. He had a significant episode last week on the disease of exertional chest discomfort. He then had another occurrence yesterday on January 5 that was concerning for possible unstable angina. Based on his risk factors and symptoms, he was seen by Dr. Ron Parker in consultation who felt the most prudent course of action was to proceed with cardiac catheterization; per patient request by radial access.  PRE-OPERATIVE DIAGNOSIS:    Crescendo Angina/Acute Coronary Syndrome  PROCEDURES PERFORMED:    Left heart catheterization with coronary angiography  PROCEDURE:Consent:  Risks of procedure as well as the alternatives and risks of each were explained to the (patient/caregiver).  Consent for procedure obtained. Consent for signed by MD and patient with RN witness -- placed on chart.   PROCEDURE: The patient was brought to the [address] Cardiac Catheterization Lab in the fasting state and prepped and draped in the usual sterile fashion for Right groin or radial access. A modified Allen's test with plethysmography was performed, revealing excellent Ulnar artery collateral flow.  Sterile technique was used including antiseptics, cap, gloves, gown, hand hygiene, mask and sheet.  Skin prep: Chlorhexidine.  Time Out: Verified patient identification, verified procedure, site/side was marked, verified correct patient position, special equipment/implants available,  medications/allergies/relevent history reviewed, required imaging and test results available.  Performed  Access: Right Radial Artery; 6 Fr Sheath -- Seldinger technique (Angiocath Micropuncture Kit)  IA Radial Cocktail, IV Heparin Diagnostic:  5 French TIG 4.0,  Angled Pigtail catheters advanced and exchanged over long exchange safety J-wire.  Left and Right Coronary Artery Angiography: TIG 4.0  LV Hemodynamics (LV Gram): Angled pigtail catheter  TR Band:  1120 Hours, 14 mL air; nonocclusive hemostasis  MEDICATIONS:  Anesthesia:  Local Lidocaine 2 ml  Sedation:  2 mg IV Versed, 50 mcg IV fentanyl ;   Omnipaque Contrast: 100 ml  Anticoagulation:  IV Heparin  4000 Units   Radial Cocktail: 5 mg Verapamil, 400 mcg NTG, 2 ml 2% Lidocaine in 10 ml NS  Hemodynamics:  Central Aortic / Mean Pressures:  145/89 mmHg;  114 mmHg  Left Ventricular Pressures / EDP:  145/7 mmHg;  13 mmHg  Left Ventriculography:  EF:  60-65%  Wall Motion:  Normal  Coronary Anatomy:  Left Main:  Large caliber, short vessel that branches into the LAD, Circumflex, and a small Ramus Intermedius. Angiographically normal. LAD:  Large-caliber vessel that courses down around the apex. It views of the inferior apex. There is several septal perforators. There 2 diagonal branches are moderate caliber. Angiographically normal  Left Circumflex:  Large-caliber vessel that gives off a small atrial branch then bifurcates into a lateral OM1 and the AV groove circumflex. The AV groove circumflex and gives off OM 24 coursing distally where branches into 2 LPL branches. Angiographically normal  OM1:  Moderate-to- large-caliber vessel that reaches down the inferior apex, angiographically normal.  OM 2:  Moderate caliber vessel that courses along the inferolateral wall. Angiographically normal  Ramus intermedius:  Small-caliber vessel that courses as  appears to be a septal perforator. They're several branches.  Angiographically normal.   RCA:  Large-caliber, codominant vessel with a ostial conus branch. The vessel then takes a very tortuous turn around the acute margin before bifurcating distally into the the Right Posterior Descending Artery branch (RPDA) and a diminutive  Right Posterior AV Groove Branch (RPAV)  RPDA:  Small-caliber vessel that reaches close to 1/2 the way to the apex  RPL Sysytem:The RPAV  very small caliber vessel which gives off 2 small posterolateral branches and the AV nodal artery.  PATIENT DISPOSITION:    The patient was transferred to the PACU holding area in a hemodynamicaly stable, chest pain free condition.  The patient tolerated the procedure well, and there were no complications.  EBL:   < 10 ml  The patient was stable before, during, and after the procedure.  POST-OPERATIVE DIAGNOSIS:    Angiographically normal coronary arteries with no evidence of any significant lesions to explain the patient's symptoms.  Well-preserved/hyperdynamic LV EF with normal EDP.  PLAN OF CARE:  Standard post radial cath care.  He will be fine for discharge for cardiac standpoint later on today, but would defer to primary service for evaluation of other etiologies. An echocardiogram has been ordered but has not been read.  The results of the procedure have been forwarded to the primary [facility] service.   Leonie Man, M.D., M.S. [facility] [address] [place], [place]  [address]  [phone]  02/09/2013 11:40 AM

## 2013-02-09 NOTE — Progress Notes (Signed)
TR band removed and pressure dressing applied.

## 2013-02-09 NOTE — Interval H&P Note (Signed)
History and Physical Interval Note:  02/09/2013 10:40 AM  Frank Gill  has presented today for surgery, with the diagnosis of Acute Coronary Syndrome.  The various methods of treatment have been discussed with the patient and family. After consideration of risks, benefits and other options for treatment, the patient has consented to  Procedure(s): LEFT HEART CATHETERIZATION WITH CORONARY ANGIOGRAM (N/A) as a surgical intervention .  The patient's history has been reviewed, patient examined, no change in status, stable for surgery.  I have reviewed the patient's chart and labs.  Questions were answered to the patient's satisfaction.     Frank Gill W  Cath Lab Visit (complete for each Cath Lab visit)  Clinical Evaluation Leading to the Procedure:   ACS: yes; Crescendo Angina  Non-ACS:    Anginal Classification: CCS IV  Anti-ischemic medical therapy: Minimal Therapy (1 class of medications)  Non-Invasive Test Results: No non-invasive testing performed  Prior CABG: No previous CABG

## 2013-02-09 NOTE — Discharge Summary (Addendum)
Physician Discharge Summary  Frank Gill ZOX:[id number] DOB: 01-Mar-1955 DOA: 02/08/2013  PCP: Redge Gainer, MD  Admit date: 02/08/2013 Discharge date: 02/09/2013  Recommendations for Outpatient Follow-up:  1. Follow up with primary care physician in 5-7 days for recheck  2. Avoid tobacco products 3. Check BMP on outpatient followup  4. Outpatient echocardiogram and outpatient carotid dopplers recommended  5. Return if symptoms recur, worsen or new problems develop.   Discharge Diagnoses:  Principal Problem:   ACS (acute coronary syndrome) Active Problems:   Syncope   Tobacco abuse   Dyslipidemia   Exertional shortness of breath    Discharge Condition: stable   Diet recommendation: heart healthy  Filed Weights   02/08/13 2100 02/09/13 0024  Weight: 168 lb 14 oz (76.6 kg) 169 lb 5 oz (76.8 kg)    History of present illness:  HPI: Frank Gill is a 58 y.o. male who presents to the ED after an episode of chest pain that occurred earlier today. Patient first had crushing, left sided, chest pain for a prolonged period of time with diaphoresis, SOB, on Thursday last week. He also has been having some SOB with activity for quite some time. He also had a syncopal episode on Thursday and one on Sat. His wife (who herself has had 3 coronary stents), begged him to come in to the ED but he decided to wait and went to his PCPs office today. At his PCPs office and EKG demonstrated apparently new Q waves suggestive of an anterior infarct. 911 was called and patient was sent emergently to the ED.  Hospital Course:  ACS (acute coronary syndrome)  Appreciate cardiology consultation and assistance. Pt had clean cath today see report. His troponins are normal.   Syncope  Planned to continue to monitor  Cardiology workup completed No telemetry events noted.  Pt refused to stay for further workup, pulled off his telemetry and said that he wanted to leave immediately and would not wait any  longer Pt was counseled and verbalized understanding Pt was ordered for an echocardiogram but he refused to stay longer, I am recommending that he have this arranged as an outpatient with his primary care physician on followup.   Tobacco abuse  The patient was counseled on the dangers of tobacco use, and was advised to quit. Reviewed strategies to maximize success, including removing cigarettes and smoking materials from environment and stress management.   Hypokalemia  - repleted - repeat BMP with PCP  Dyslipidemia  Continue home statin therapy   Exertional shortness of breath  Counseled on tobacco cessation   Abnormal chest x-ray  Repeat CXR came back within normal limits  No s/s of infection at this time  Incentive spirometry recommended   Procedures:  Cath NTERVENTIONAL CARDIOLOGIST: Leonie Man, M.D., MS  PRIMARY CARE PROVIDER: Redge Gainer, MD  PRIMARY CARDIOLOGIST: Dola Argyle, MD  PATIENT: Frank Gill is a 58 y.o. male with multiple cardiac risk factors including hypertension, hyperlipidemia and chronic smoker. He also has significant amount family members with cardiac disease. He himself has no prior documented coronary disease. He had a significant episode last week on the disease of exertional chest discomfort. He then had another occurrence yesterday on January 5 that was concerning for possible unstable angina. Based on his risk factors and symptoms, he was seen by Dr. Ron Parker in consultation who felt the most prudent course of action was to proceed with cardiac catheterization; per patient request by radial access.  PRE-OPERATIVE DIAGNOSIS:  Crescendo Angina/Acute Coronary Syndrome PROCEDURES PERFORMED:  Left heart catheterization with coronary angiography PROCEDURE:Consent: Risks of procedure as well as the alternatives and risks of each were explained to the (patient/caregiver). Consent for procedure obtained.  Consent for signed by MD and patient with RN  witness -- placed on chart.  PROCEDURE: The patient was brought to the [facility] in the fasting state and prepped and draped in the usual sterile fashion for Right groin or radial access. A modified Allen's test with plethysmography was performed, revealing excellent Ulnar artery collateral flow. Sterile technique was used including antiseptics, cap, gloves, gown, hand hygiene, mask and sheet. Skin prep: Chlorhexidine.  Time Out: Verified patient identification, verified procedure, site/side was marked, verified correct patient position, special equipment/implants available, medications/allergies/relevent history reviewed, required imaging and test results available. Performed  Access: Right Radial Artery; 6 Fr Sheath -- Seldinger technique (Angiocath Micropuncture Kit)  IA Radial Cocktail, IV Heparin Diagnostic: 5 French TIG 4.0, Angled Pigtail catheters advanced and exchanged over long exchange safety J-wire.  Left and Right Coronary Artery Angiography: TIG 4.0  LV Hemodynamics (LV Gram): Angled pigtail catheter TR Band: 1120 Hours, 14 mL air; nonocclusive hemostasis  MEDICATIONS:  Anesthesia: Local Lidocaine 2 ml Sedation: 2 mg IV Versed, 50 mcg IV fentanyl ;  Omnipaque Contrast: 100 ml  Anticoagulation: IV Heparin 4000 Units  Radial Cocktail: 5 mg Verapamil, 400 mcg NTG, 2 ml 2% Lidocaine in 10 ml NS Hemodynamics:  Central Aortic / Mean Pressures: 145/89 mmHg; 114 mmHg  Left Ventricular Pressures / EDP: 145/7 mmHg; 13 mmHg Left Ventriculography:  EF: 60-65%  Wall Motion: Normal Coronary Anatomy:  Left Main: Large caliber, short vessel that branches into the LAD, Circumflex, and a small Ramus Intermedius. Angiographically normal. LAD: Large-caliber vessel that courses down around the apex. It views of the inferior apex. There is several septal perforators. There 2 diagonal branches are moderate caliber. Angiographically normal  Left Circumflex:  Large-caliber vessel that gives off a small atrial branch then bifurcates into a lateral OM1 and the AV groove circumflex. The AV groove circumflex and gives off OM 24 coursing distally where branches into 2 LPL branches. Angiographically normal  OM1: Moderate-to- large-caliber vessel that reaches down the inferior apex, angiographically normal.  OM 2: Moderate caliber vessel that courses along the inferolateral wall. Angiographically normal Ramus intermedius: Small-caliber vessel that courses as appears to be a septal perforator. They're several branches. Angiographically normal.  RCA: Large-caliber, codominant vessel with a ostial conus branch. The vessel then takes a very tortuous turn around the acute margin before bifurcating distally into the the Right Posterior Descending Artery branch (RPDA) and a diminutive Right Posterior AV Groove Branch (RPAV)  RPDA: Small-caliber vessel that reaches close to 1/2 the way to the apex  RPL Sysytem:The RPAV very small caliber vessel which gives off 2 small posterolateral branches and the AV nodal artery. PATIENT DISPOSITION:  The patient was transferred to the PACU holding area in a hemodynamicaly stable, chest pain free condition.  The patient tolerated the procedure well, and there were no complications. EBL: < 10 ml  The patient was stable before, during, and after the procedure. POST-OPERATIVE DIAGNOSIS:  Angiographically normal coronary arteries with no evidence of any significant lesions to explain the patient's symptoms.  Well-preserved/hyperdynamic LV EF with normal EDP. PLAN OF CARE:  Standard post radial cath care.  He will be fine for discharge for cardiac standpoint later on today, but would defer to primary service for  evaluation of other etiologies. An echocardiogram has been ordered but has not been read.  Consultations: Cardiology  Discharge Exam: Filed Vitals:   02/09/13 1330  BP: 119/63  Pulse: 59  Temp:   Resp:    Discharge  Instructions  Discharge Orders   Future Appointments Provider Department Dept Phone   05/06/2013 10:00 AM Lysbeth Penner, [facility] [phone]   Future Orders Complete By Expires   Discharge instructions  As directed    Comments:     Return if symptoms recur, worsen or new problems develop.  Follow up with primary care physician in 5 days for recheck.   Discontinue IV  As directed    Increase activity slowly  As directed        Medication List         aspirin 81 MG tablet  Take 81 mg by mouth daily.     atorvastatin 40 MG tablet  Commonly known as:  LIPITOR  Take 40 mg by mouth at bedtime.     CALCIUM PO  Take 1 tablet by mouth daily. Takes 2066m every day     docusate sodium 100 MG capsule  Commonly known as:  COLACE  Take 100 mg by mouth at bedtime.     Fenofibrate 150 MG Caps  Commonly known as:  LIPOFEN  Take 1 capsule (150 mg total) by mouth 1 day or 1 dose.     gabapentin 600 MG tablet  Commonly known as:  NEURONTIN  Take 1 tablet (600 mg total) by mouth 3 (three) times daily.     GARLIC PO  Take 1 capsule by mouth daily.     loratadine 10 MG tablet  Commonly known as:  CLARITIN  Take 10 mg by mouth daily.     multivitamin with minerals Tabs tablet  Take 1 tablet by mouth daily.     naproxen sodium 550 MG tablet  Commonly known as:  ANAPROX  Take 1 tablet (550 mg total) by mouth 2 (two) times daily with a meal.     nitroGLYCERIN 0.4 MG SL tablet  Commonly known as:  NITROSTAT  Place 0.4 mg under the tongue every 5 (five) minutes as needed for chest pain.     traMADol 50 MG tablet  Commonly known as:  ULTRAM  Take 50 mg by mouth every 6 (six) hours as needed for moderate pain. TAKE ONE TABLET BY MOUTH EVERY DAY       No Known Allergies     Follow-up Information   Follow up with MRedge Gainer MD. Schedule an appointment as soon as possible for a visit in 5 days. ([facility] )    Specialty:  Family  Medicine   Contact information:   [address] [phone]       The results of significant diagnostics from this hospitalization (including imaging, microbiology, ancillary and laboratory) are listed below for reference.    Significant Diagnostic Studies: Dg Chest 2 View  02/08/2013   CLINICAL DATA:  Chest pain  EXAM: CHEST  2 VIEW  COMPARISON:  None.  FINDINGS: Mild elevation of the left hemidiaphragm is noted. There is mild left base atelectasis. There is mild patchy infiltrate in the right base. Elsewhere lungs are clear. Heart size and pulmonary vascularity are normal. No adenopathy. No pneumothorax. There is evidence of an old fracture of the right clavicle, healed.  IMPRESSION: Area of patchy infiltrate right base.  Mild left base  atelectasis.   Electronically Signed   By: Lowella Grip M.D.   On: 02/08/2013 18:52   Dg Chest Port 1 View  02/09/2013   CLINICAL DATA:  Shortness of breath.  EXAM: PORTABLE CHEST - 1 VIEW  COMPARISON:  02/08/2013.  FINDINGS: Mediastinum and hilar structures are normal. Stable elevation of left hemidiaphragm is present. Mild atelectasis left lung base. Heart size and pulmonary vascularity normal. No acute bony abnormality .  IMPRESSION: No acute abnormality.  Stable elevation left hemidiaphragm.   Electronically Signed   By: Marcello Moores  Register   On: 02/09/2013 13:03    Microbiology: No results found for this or any previous visit (from the past 240 hour(s)).   Labs: Basic Metabolic Panel:  Recent Labs Lab 02/08/13 1735 02/09/13 0600  NA 143 141  K 3.4* 3.3*  CL 104 102  CO2 27 26  GLUCOSE 89 97  BUN 15 14  CREATININE 1.08 1.07  CALCIUM 8.9 8.6   Liver Function Tests:  Recent Labs Lab 02/08/13 1735  AST 26  ALT 21  ALKPHOS 44  BILITOT 0.3  PROT 6.9  ALBUMIN 4.3   No results found for this basename: LIPASE, AMYLASE,  in the last 168 hours No results found for this basename: AMMONIA,  in the last 168  hours CBC:  Recent Labs Lab 02/08/13 1735 02/09/13 0600  WBC 6.4 6.4  HGB 15.3 15.2  HCT 44.0 43.8  MCV 94.8 94.4  PLT 215 220   Cardiac Enzymes:  Recent Labs Lab 02/08/13 1735 02/08/13 2114 02/09/13 0600 02/09/13 0855  TROPONINI <0.30 <0.30 <0.30 <0.30   BNP: BNP (last 3 results) No results found for this basename: PROBNP,  in the last 8760 hours CBG: No results found for this basename: GLUCAP,  in the last 168 hours  Signed:  Clanford Johnson  [facility] 02/09/2013, 3:07 PM

## 2013-02-15 ENCOUNTER — Ambulatory Visit: Payer: Medicare HMO | Admitting: Family Medicine

## 2013-03-02 ENCOUNTER — Ambulatory Visit: Payer: Medicare HMO | Admitting: Family Medicine

## 2013-03-08 ENCOUNTER — Encounter (INDEPENDENT_AMBULATORY_CARE_PROVIDER_SITE_OTHER): Payer: Self-pay

## 2013-03-08 ENCOUNTER — Ambulatory Visit (INDEPENDENT_AMBULATORY_CARE_PROVIDER_SITE_OTHER): Payer: Medicare HMO | Admitting: Family Medicine

## 2013-03-08 ENCOUNTER — Encounter: Payer: Self-pay | Admitting: Family Medicine

## 2013-03-08 ENCOUNTER — Observation Stay (HOSPITAL_COMMUNITY)
Admission: EM | Admit: 2013-03-08 | Discharge: 2013-03-10 | Disposition: A | Discharge status: Home / Self Care | Payer: Medicare HMO | Attending: Family Medicine | Admitting: Family Medicine

## 2013-03-08 ENCOUNTER — Inpatient Hospital Stay (HOSPITAL_COMMUNITY): Payer: Medicare HMO

## 2013-03-08 ENCOUNTER — Encounter (HOSPITAL_COMMUNITY): Payer: Self-pay | Admitting: Emergency Medicine

## 2013-03-08 VITALS — BP 149/86 | HR 68 | Temp 96.3°F | Ht 65.0 in | Wt 165.4 lb

## 2013-03-08 DIAGNOSIS — F4310 Post-traumatic stress disorder, unspecified: Secondary | ICD-10-CM | POA: Diagnosis present

## 2013-03-08 DIAGNOSIS — IMO0002 Reserved for concepts with insufficient information to code with codable children: Secondary | ICD-10-CM

## 2013-03-08 DIAGNOSIS — R55 Syncope and collapse: Secondary | ICD-10-CM

## 2013-03-08 DIAGNOSIS — Z720 Tobacco use: Secondary | ICD-10-CM | POA: Diagnosis present

## 2013-03-08 DIAGNOSIS — I200 Unstable angina: Secondary | ICD-10-CM

## 2013-03-08 DIAGNOSIS — I498 Other specified cardiac arrhythmias: Secondary | ICD-10-CM | POA: Insufficient documentation

## 2013-03-08 DIAGNOSIS — F339 Major depressive disorder, recurrent, unspecified: Secondary | ICD-10-CM | POA: Diagnosis present

## 2013-03-08 DIAGNOSIS — F32A Depression, unspecified: Secondary | ICD-10-CM

## 2013-03-08 DIAGNOSIS — E781 Pure hyperglyceridemia: Secondary | ICD-10-CM | POA: Diagnosis present

## 2013-03-08 DIAGNOSIS — F17200 Nicotine dependence, unspecified, uncomplicated: Secondary | ICD-10-CM | POA: Insufficient documentation

## 2013-03-08 DIAGNOSIS — R001 Bradycardia, unspecified: Secondary | ICD-10-CM | POA: Diagnosis present

## 2013-03-08 DIAGNOSIS — R7309 Other abnormal glucose: Secondary | ICD-10-CM | POA: Insufficient documentation

## 2013-03-08 DIAGNOSIS — Z7982 Long term (current) use of aspirin: Secondary | ICD-10-CM | POA: Insufficient documentation

## 2013-03-08 DIAGNOSIS — D72829 Elevated white blood cell count, unspecified: Secondary | ICD-10-CM | POA: Diagnosis present

## 2013-03-08 DIAGNOSIS — R739 Hyperglycemia, unspecified: Secondary | ICD-10-CM | POA: Diagnosis present

## 2013-03-08 DIAGNOSIS — E785 Hyperlipidemia, unspecified: Secondary | ICD-10-CM | POA: Diagnosis present

## 2013-03-08 DIAGNOSIS — K5900 Constipation, unspecified: Secondary | ICD-10-CM

## 2013-03-08 DIAGNOSIS — M5430 Sciatica, unspecified side: Secondary | ICD-10-CM | POA: Insufficient documentation

## 2013-03-08 DIAGNOSIS — R0602 Shortness of breath: Secondary | ICD-10-CM | POA: Diagnosis present

## 2013-03-08 DIAGNOSIS — L02212 Cutaneous abscess of back [any part, except buttock]: Secondary | ICD-10-CM

## 2013-03-08 DIAGNOSIS — F329 Major depressive disorder, single episode, unspecified: Secondary | ICD-10-CM

## 2013-03-08 DIAGNOSIS — F3289 Other specified depressive episodes: Secondary | ICD-10-CM

## 2013-03-08 DIAGNOSIS — K649 Unspecified hemorrhoids: Secondary | ICD-10-CM

## 2013-03-08 DIAGNOSIS — I249 Acute ischemic heart disease, unspecified: Secondary | ICD-10-CM

## 2013-03-08 HISTORY — DX: Syncope and collapse: R55

## 2013-03-08 LAB — CBC WITH DIFFERENTIAL/PLATELET
Basophils Absolute: 0 10*3/uL (ref 0.0–0.1)
Basophils Relative: 0 % (ref 0–1)
Eosinophils Absolute: 0.4 10*3/uL (ref 0.0–0.7)
Eosinophils Relative: 3 % (ref 0–5)
HCT: 45.2 % (ref 39.0–52.0)
Hemoglobin: 15.9 g/dL (ref 13.0–17.0)
Lymphocytes Relative: 12 % (ref 12–46)
Lymphs Abs: 1.4 10*3/uL (ref 0.7–4.0)
MCH: 32.9 pg (ref 26.0–34.0)
MCHC: 35.2 g/dL (ref 30.0–36.0)
MCV: 93.6 fL (ref 78.0–100.0)
Monocytes Absolute: 0.7 10*3/uL (ref 0.1–1.0)
Monocytes Relative: 6 % (ref 3–12)
Neutro Abs: 8.9 10*3/uL — ABNORMAL HIGH (ref 1.7–7.7)
Neutrophils Relative %: 78 % — ABNORMAL HIGH (ref 43–77)
Platelets: 238 10*3/uL (ref 150–400)
RBC: 4.83 MIL/uL (ref 4.22–5.81)
RDW: 13.8 % (ref 11.5–15.5)
WBC: 11.4 10*3/uL — ABNORMAL HIGH (ref 4.0–10.5)

## 2013-03-08 LAB — URINALYSIS, ROUTINE W REFLEX MICROSCOPIC
Bilirubin Urine: NEGATIVE
GLUCOSE, UA: NEGATIVE mg/dL
Hgb urine dipstick: NEGATIVE
Ketones, ur: NEGATIVE mg/dL
LEUKOCYTES UA: NEGATIVE
Nitrite: NEGATIVE
PH: 6 (ref 5.0–8.0)
Protein, ur: NEGATIVE mg/dL
Specific Gravity, Urine: 1.006 (ref 1.005–1.030)
Urobilinogen, UA: 0.2 mg/dL (ref 0.0–1.0)

## 2013-03-08 LAB — CREATININE, SERUM
CREATININE: 0.92 mg/dL (ref 0.50–1.35)
GFR calc Af Amer: >90 mL/min (ref >90)
GFR calc non Af Amer: >90 mL/min (ref >90)

## 2013-03-08 LAB — CBC
HCT: 42.2 % (ref 39.0–52.0)
Hemoglobin: 14.7 g/dL (ref 13.0–17.0)
MCH: 32.5 pg (ref 26.0–34.0)
MCHC: 34.8 g/dL (ref 30.0–36.0)
MCV: 93.2 fL (ref 78.0–100.0)
PLATELETS: 218 10*3/uL (ref 150–400)
RBC: 4.53 MIL/uL (ref 4.22–5.81)
RDW: 13.7 % (ref 11.5–15.5)
WBC: 7.7 10*3/uL (ref 4.0–10.5)

## 2013-03-08 LAB — GLUCOSE, POCT (MANUAL RESULT ENTRY): POC Glucose: 124 mg/dl — AB (ref 70–99)

## 2013-03-08 LAB — RAPID URINE DRUG SCREEN, HOSP PERFORMED
AMPHETAMINES: NOT DETECTED
BENZODIAZEPINES: NOT DETECTED
Barbiturates: NOT DETECTED
Cocaine: NOT DETECTED
OPIATES: NOT DETECTED
TETRAHYDROCANNABINOL: NOT DETECTED

## 2013-03-08 LAB — BASIC METABOLIC PANEL
BUN: 14 mg/dL (ref 6–23)
CO2: 26 mEq/L (ref 19–32)
Calcium: 9.5 mg/dL (ref 8.4–10.5)
Chloride: 101 mEq/L (ref 96–112)
Creatinine, Ser: 1.1 mg/dL (ref 0.50–1.35)
GFR calc Af Amer: 84 mL/min — ABNORMAL LOW (ref >90)
GFR calc non Af Amer: 72 mL/min — ABNORMAL LOW (ref >90)
Glucose, Bld: 101 mg/dL — ABNORMAL HIGH (ref 70–99)
Potassium: 3.8 mEq/L (ref 3.7–5.3)
Sodium: 142 mEq/L (ref 137–147)

## 2013-03-08 LAB — TROPONIN I
Troponin I: <0.3 ng/mL (ref <0.30)
Troponin I: <0.3 ng/mL (ref <0.30)

## 2013-03-08 LAB — MAGNESIUM: Magnesium: 1.9 mg/dL (ref 1.5–2.5)

## 2013-03-08 MED ORDER — PANTOPRAZOLE SODIUM 40 MG PO TBEC
40.0000 mg | DELAYED_RELEASE_TABLET | Freq: Every day | ORAL | Status: DC
  Administered 2013-03-09 – 2013-03-10 (×2): 40 mg via ORAL
  Filled 2013-03-08 (×2): qty 1

## 2013-03-08 MED ORDER — DOCUSATE SODIUM 100 MG PO CAPS: 100.0000 mg | ORAL_CAPSULE | Freq: Two times a day (BID) | ORAL | Status: DC

## 2013-03-08 MED ORDER — ALBUTEROL SULFATE HFA 108 (90 BASE) MCG/ACT IN AERS: 1.0000 | INHALATION_SPRAY | Freq: Four times a day (QID) | RESPIRATORY_TRACT | Status: DC | PRN

## 2013-03-08 MED ORDER — CITALOPRAM HYDROBROMIDE 20 MG PO TABS
20.0000 mg | ORAL_TABLET | Freq: Every day | ORAL | Status: DC
  Administered 2013-03-08 – 2013-03-10 (×3): 20 mg via ORAL
  Filled 2013-03-08 (×3): qty 1

## 2013-03-08 MED ORDER — ADULT MULTIVITAMIN W/MINERALS CH
1.0000 | ORAL_TABLET | Freq: Every day | ORAL | Status: DC
  Administered 2013-03-08 – 2013-03-10 (×3): 1 via ORAL
  Filled 2013-03-08 (×3): qty 1

## 2013-03-08 MED ORDER — SODIUM CHLORIDE 0.9 % IJ SOLN
3.0000 mL | Freq: Two times a day (BID) | INTRAMUSCULAR | Status: DC
  Administered 2013-03-08: 3 mL via INTRAVENOUS

## 2013-03-08 MED ORDER — ALBUTEROL SULFATE (2.5 MG/3ML) 0.083% IN NEBU: 2.5000 mg | INHALATION_SOLUTION | Freq: Four times a day (QID) | RESPIRATORY_TRACT | Status: DC | PRN

## 2013-03-08 MED ORDER — SODIUM CHLORIDE 0.9 % IV BOLUS (SEPSIS)
1000.0000 mL | Freq: Once | INTRAVENOUS | Status: AC
  Administered 2013-03-08: 1000 mL via INTRAVENOUS

## 2013-03-08 MED ORDER — DOCUSATE SODIUM 100 MG PO CAPS
200.0000 mg | ORAL_CAPSULE | Freq: Every day | ORAL | Status: DC
  Administered 2013-03-08 – 2013-03-09 (×2): 200 mg via ORAL
  Filled 2013-03-08 (×3): qty 2

## 2013-03-08 MED ORDER — HYDROCORTISONE 2.5 % RE CREA
1.0000 "application " | TOPICAL_CREAM | Freq: Two times a day (BID) | RECTAL | Status: DC | PRN
  Filled 2013-03-08: qty 28.35

## 2013-03-08 MED ORDER — HYDROCODONE-ACETAMINOPHEN 5-325 MG PO TABS: 1.0000 | ORAL_TABLET | Freq: Four times a day (QID) | ORAL | Status: DC | PRN

## 2013-03-08 MED ORDER — ATORVASTATIN CALCIUM 40 MG PO TABS
40.0000 mg | ORAL_TABLET | Freq: Every day | ORAL | Status: DC
  Administered 2013-03-08 – 2013-03-09 (×2): 40 mg via ORAL
  Filled 2013-03-08 (×3): qty 1

## 2013-03-08 MED ORDER — LORATADINE 10 MG PO TABS
10.0000 mg | ORAL_TABLET | Freq: Every day | ORAL | Status: DC
  Administered 2013-03-08 – 2013-03-10 (×3): 10 mg via ORAL
  Filled 2013-03-08 (×3): qty 1

## 2013-03-08 MED ORDER — NAPROXEN 500 MG PO TABS
500.0000 mg | ORAL_TABLET | Freq: Two times a day (BID) | ORAL | Status: DC | PRN
  Filled 2013-03-08: qty 1

## 2013-03-08 MED ORDER — ASPIRIN 81 MG PO TABS: 81.0000 mg | ORAL_TABLET | Freq: Every day | ORAL | Status: DC

## 2013-03-08 MED ORDER — HYDROCORTISONE 2.5 % RE CREA: 1.0000 "application " | TOPICAL_CREAM | Freq: Two times a day (BID) | RECTAL | Status: DC

## 2013-03-08 MED ORDER — METOPROLOL SUCCINATE ER 50 MG PO TB24: 50.0000 mg | ORAL_TABLET | Freq: Every day | ORAL | Status: DC

## 2013-03-08 MED ORDER — HEPARIN SODIUM (PORCINE) 5000 UNIT/ML IJ SOLN
5000.0000 [IU] | Freq: Three times a day (TID) | INTRAMUSCULAR | Status: DC
  Administered 2013-03-08 – 2013-03-10 (×5): 5000 [IU] via SUBCUTANEOUS
  Filled 2013-03-08 (×8): qty 1

## 2013-03-08 MED ORDER — FENOFIBRATE 160 MG PO TABS
160.0000 mg | ORAL_TABLET | Freq: Every day | ORAL | Status: DC
  Administered 2013-03-08 – 2013-03-10 (×3): 160 mg via ORAL
  Filled 2013-03-08 (×3): qty 1

## 2013-03-08 MED ORDER — ASPIRIN EC 81 MG PO TBEC
81.0000 mg | DELAYED_RELEASE_TABLET | Freq: Every day | ORAL | Status: DC
  Administered 2013-03-08 – 2013-03-10 (×3): 81 mg via ORAL
  Filled 2013-03-08 (×3): qty 1

## 2013-03-08 MED ORDER — CITALOPRAM HYDROBROMIDE 20 MG PO TABS: 20.0000 mg | ORAL_TABLET | Freq: Every day | ORAL | Status: DC

## 2013-03-08 MED ORDER — SODIUM CHLORIDE 0.9 % IV SOLN
INTRAVENOUS | Status: DC
  Administered 2013-03-08: 21:00:00 via INTRAVENOUS

## 2013-03-08 MED ORDER — NAPROXEN SODIUM 550 MG PO TABS
550.0000 mg | ORAL_TABLET | Freq: Two times a day (BID) | ORAL | Status: DC | PRN
  Filled 2013-03-08: qty 1

## 2013-03-08 MED ORDER — GABAPENTIN 600 MG PO TABS
600.0000 mg | ORAL_TABLET | Freq: Three times a day (TID) | ORAL | Status: DC
  Administered 2013-03-08 – 2013-03-10 (×5): 600 mg via ORAL
  Filled 2013-03-08 (×7): qty 1

## 2013-03-08 NOTE — Addendum Note (Signed)
Addended by: Orma RenderHODGES, Phelicia Dantes F on: 03/08/2013 10:08 AM   Modules accepted: Orders

## 2013-03-08 NOTE — Progress Notes (Addendum)
RN administered pt medications. RN monitored pt while pt took medication. Pt swallowed medication. RN asked pt to open his mouth to make sure all the medications were swallowed bc ED RN stated in report that pt had some trouble swallowing. Pt had no medications in his mouth.  I accessed the computer in the room and began to chart at the bedside. I asked the pt a question and I seen a swallow white oval "pill-like" in his mouth. This pill did not look like a pill i had given him. I asked him did he have trouble swallowing the medications I gave him. The pt stated I swallowed all the pills you gave, this is one of my pills.I asked the pt to take pill out of his mouth. He then laughed and said it was candy. I told the pt I would alert our Charge Nurse about the situation. The pt then spit the object out. I educated the pt that he was not to take any medication that a RN did not administer while in the hospital. I  Searched the area and did not see any pills in the pt's possession.  I notified the Charge Nurse about the incident.  Pt alert and orientated and currently in no distress will continue to monitor. Vital signs stable

## 2013-03-08 NOTE — ED Notes (Signed)
Attempted report 

## 2013-03-08 NOTE — H&P (Signed)
History and Physical Examination  Frank Gill VWU:[id number] DOB: Nov 02, 1955 DOA: 03/08/2013  Referring physician: Juleen China, MD PCP: [name], MD  Specialists: [facility]   Chief Complaint: Almost passed out   HPI: Frank Gill is a 58 y.o. male who was recently discharged 3 weeks ago with chest pain was being seen by his PCP today for hospital followup when he complained about constipation.  He received a rectal exam in the office.  He tolerated the exam.  He was sitting on the exam room table and joking around when he suddenly felt dizzy and felt extremely weak.  He became pale and ill.  His blood pressure dropped and he became bradycardic.  His heart rate was in the 30s.  EMS was called and patient was brought to the [facility] ER for further evaluation.  He received IVFs and oxygen.  The patient denies having chest pain.  He has chronic shortness of breath.  He does says that he had chills for last couple of days.  He became unresponsive for a period of about a minute. He reported mild nausea and diaphoresis.  During his recent hospitalization for chest pain as well as syncope he had cardiac catheterization which showed no significant obstruction. Patient was unwilling to participate further recommended testing such as echocardiogram and carotid doppler studies. He states that he been feeling relatively well since he was discharged. He does walk approximately 2 miles on a daily basis. He is a smoker. Hospitalization was requested for ongoing monitoring and to complete a syncope evaluation.  The patient does not take any blood pressure medications.  He apparently did have an elevated blood pressure when he arrived to the PCP office today and he was prescribed toprol XL but he never got the prescription filled and never started the medication because he was rushed to the ER.    Past Medical History Past Medical History  Diagnosis Date  . Arthritis   . Hyperlipidemia   . Shortness of breath    . Constipation   . Left sided sciatica    Past Surgical History Past Surgical History  Procedure Laterality Date  . Cardiac catheterization     Home Meds: Prior to Admission medications   Medication Sig Start Date End Date Taking? Authorizing Provider  albuterol (PROVENTIL HFA;VENTOLIN HFA) 108 (90 BASE) MCG/ACT inhaler Inhale 1-2 puffs into the lungs every 6 (six) hours as needed for shortness of breath.   Yes Historical Provider, MD  aspirin 81 MG tablet Take 81 mg by mouth daily.   Yes Historical Provider, MD  atorvastatin (LIPITOR) 40 MG tablet Take 40 mg by mouth at bedtime.   Yes Historical Provider, MD  Cholecalciferol (VITAMIN D) 2000 UNITS CAPS Take 2,000 Units by mouth every evening.   Yes Historical Provider, MD  docusate sodium (COLACE) 100 MG capsule Take 200 mg by mouth at bedtime.    Yes Historical Provider, MD  Fenofibrate 150 MG CAPS Take 150 mg by mouth at bedtime. 01/04/13  Yes Deatra Canter, FNP  gabapentin (NEURONTIN) 600 MG tablet Take 1 tablet (600 mg total) by mouth 3 (three) times daily. 01/04/13  Yes Deatra Canter, FNP  GARLIC PO Take 1 capsule by mouth daily.   Yes Historical Provider, MD  loratadine (CLARITIN) 10 MG tablet Take 10 mg by mouth daily.   Yes Historical Provider, MD  Multiple Vitamin (MULTIVITAMIN WITH MINERALS) TABS tablet Take 1 tablet by mouth daily.   Yes Historical Provider, MD  naproxen sodium (ANAPROX) 550 MG tablet Take 1 tablet (550 mg total) by mouth 2 (two) times daily with a meal. 01/04/13  Yes Deatra Canter, FNP  traMADol (ULTRAM) 50 MG tablet Take 50 mg by mouth 2 (two) times daily.  01/04/13  Yes Deatra Canter, FNP  citalopram (CELEXA) 20 MG tablet Take 1 tablet (20 mg total) by mouth daily. 03/08/13   Deatra Canter, FNP  hydrocortisone (PROCTOSOL HC) 2.5 % rectal cream Place 1 application rectally 2 (two) times daily. 03/08/13   Deatra Canter, FNP                 Allergies: Review of patient's allergies indicates no  known allergies.  Social History:  History   Social History  . Marital Status: Divorced    Spouse Name: N/A    Number of Children: N/A  . Years of Education: N/A   Occupational History  . Not on file.   Social History Main Topics  . Smoking status: Current Every Day Smoker -- 0.50 packs/day for 30 years    Types: Cigars    Start date: 05/21/1979  . Smokeless tobacco: Not on file  . Alcohol Use: Yes     Comment: occ  . Drug Use: No  . Sexual Activity: Not on file   Other Topics Concern  . Not on file   Social History Narrative  . No narrative on file   Family History:  Family History  Problem Relation Age of Onset  . Heart disease Mother   . Cancer Father   . Mental illness Sister     Review of Systems: Review of Systems  Constitutional: Positive for chills. Negative for fever, weight loss, malaise/fatigue and diaphoresis.  HENT: Positive for congestion. Negative for ear discharge, ear pain, hearing loss, nosebleeds and tinnitus.   Eyes: Negative for blurred vision, double vision and photophobia.  Respiratory: Positive for cough, sputum production, shortness of breath and wheezing. Negative for hemoptysis.   Cardiovascular: Negative for chest pain, palpitations and orthopnea.  Gastrointestinal: Positive for nausea. Negative for heartburn, vomiting, abdominal pain, diarrhea, constipation, blood in stool and melena.  Genitourinary: Negative for dysuria, urgency, frequency and hematuria.  Musculoskeletal: Positive for falls. Negative for back pain, joint pain, myalgias and neck pain.  Skin: Negative for itching and rash.  Neurological: Positive for dizziness and loss of consciousness. Negative for tingling, tremors, sensory change, focal weakness, seizures, weakness and headaches.  Endo/Heme/Allergies: Negative for environmental allergies and polydipsia. Does not bruise/bleed easily.  Psychiatric/Behavioral: Positive for depression. Negative for suicidal ideas,  hallucinations, memory loss and substance abuse. The patient is not nervous/anxious and does not have insomnia.   All other systems reviewed and are negative.  Physical Exam: Blood pressure 155/91, pulse 50, temperature 98.1 F (36.7 C), temperature source Oral, resp. rate 20, SpO2 96.00%. General appearance: alert, cooperative, appears stated age and no distress Head: Normocephalic, without obvious abnormality, atraumatic Eyes: negative findings: conjunctivae and sclerae normal, corneas clear, pupils equal, round, reactive to light and accomodation and visual fields full to confrontation Ears: normal TM's and external ear canals both ears Nose: Nares normal. Septum midline. Mucosa normal. No drainage or sinus tenderness., no discharge Throat: dry mucus membranes Neck: no adenopathy, no carotid bruit, no JVD, supple, symmetrical, trachea midline and thyroid not enlarged, symmetric, no tenderness/mass/nodules Back: symmetric, no curvature. ROM normal. No CVA tenderness. Lungs: clear to auscultation bilaterally and normal percussion bilaterally Chest wall: no tenderness Heart: S1, S2 normal and  bradycardic rate around 50 beats/minute Abdomen: soft, non-tender; bowel sounds normal; no masses,  no organomegaly Extremities: extremities normal, atraumatic, no cyanosis or edema Pulses: 2+ and symmetric Skin: Skin color, texture, turgor normal. No rashes or lesions Lymph nodes: Cervical, supraclavicular, and axillary nodes normal. Neurologic: Alert and oriented X 3, normal strength and tone. Normal symmetric reflexes. Normal coordination and gait  Lab  And Imaging results:  Results for orders placed during the hospital encounter of 03/08/13 (from the past 24 hour(s))  CBC WITH DIFFERENTIAL     Status: Abnormal   Collection Time    03/08/13 11:22 AM      Result Value Range   WBC 11.4 (*) 4.0 - 10.5 K/uL   RBC 4.83  4.22 - 5.81 MIL/uL   Hemoglobin 15.9  13.0 - 17.0 g/dL   HCT 16.1  09.6 - 04.5  %   MCV 93.6  78.0 - 100.0 fL   MCH 32.9  26.0 - 34.0 pg   MCHC 35.2  30.0 - 36.0 g/dL   RDW 40.9  81.1 - 91.4 %   Platelets 238  150 - 400 K/uL   Neutrophils Relative % 78 (*) 43 - 77 %   Neutro Abs 8.9 (*) 1.7 - 7.7 K/uL   Lymphocytes Relative 12  12 - 46 %   Lymphs Abs 1.4  0.7 - 4.0 K/uL   Monocytes Relative 6  3 - 12 %   Monocytes Absolute 0.7  0.1 - 1.0 K/uL   Eosinophils Relative 3  0 - 5 %   Eosinophils Absolute 0.4  0.0 - 0.7 K/uL   Basophils Relative 0  0 - 1 %   Basophils Absolute 0.0  0.0 - 0.1 K/uL  BASIC METABOLIC PANEL     Status: Abnormal   Collection Time    03/08/13 11:22 AM      Result Value Range   Sodium 142  137 - 147 mEq/L   Potassium 3.8  3.7 - 5.3 mEq/L   Chloride 101  96 - 112 mEq/L   CO2 26  19 - 32 mEq/L   Glucose, Bld 101 (*) 70 - 99 mg/dL   BUN 14  6 - 23 mg/dL   Creatinine, Ser 7.82  0.50 - 1.35 mg/dL   Calcium 9.5  8.4 - 95.6 mg/dL   GFR calc non Af Amer 72 (*) >90 mL/min   GFR calc Af Amer 84 (*) >90 mL/min  TROPONIN I     Status: None   Collection Time    03/08/13 11:22 AM      Result Value Range   Troponin I <0.30  <0.30 ng/mL    Impression / Plan   Vaso vagal episode / Vasovagal near-syncope- I suspect that patient may have experienced this near syncopal event after having a rectal exam, however, he will need a full syncope workup and will not assume that this is the cause until other causes have been investigated and ruled out.  Will order 2d echo, carotid doppler studies.     Bradycardia - The patient is not taking any beta blockers at this time, will check a urine tox screen, admit to a telemetry bed, cycle troponin, check TSH, blood cultures, orthostatic vitals. He does clinically appear very dehydrated.  Will provide IVFs. Check magnesium.     Tobacco abuse - The patient was counseled on the dangers of tobacco use, and was advised to quit, referred to a tobacco cessation program and reluctant to quit.  Reviewed  strategies to maximize  success, including removing cigarettes and smoking materials from environment, stress management, support of family/friends and written materials.  Dyslipidemia - check a fasting lipid panel  Exertional shortness of breath - smoking cessation advised as above, check 2 view CXR.   Leukocytosis, unspecified - pt had a normal WBC count when he was recently discharged. Checking CXR, blood cultures, urinalysis, urine tox screen, repeat CBC with differential in AM.     Hyperglycemia - check A1c, TSH    Constipation - will provide laxatives as needed    Hypertriglyceridemia - resume home meds, check fasting lipid panel   DEPRESSION, MAJOR, RECURRENT -resume home meds    POST TRAUMATIC STRESS DISORDER  Code Status: Full Family Communication: wife at bedside  Standley Dakinslanford Johnson MD [facility] [place], [place] [phone] 03/08/2013, 5:23 PM

## 2013-03-08 NOTE — Progress Notes (Signed)
   Subjective:    Patient ID: Frank Gill, male    DOB: 1955/11/12, 58 y.o.   MRN: [id number]  HPI  This 58 y.o. male presents for evaluation of follow up from hospital.  He was seen on 02/09/12 in the ED for chest pain and syncopal episode.  He did have ACS and had negative troponins.  He did get cardiology consult and he had undergo cardiac cath and echo.  He did not get coronary artery stents. He c/o depression and anger problems.  He has been having rectal bleeding and constipation.  He   Review of Systems    No chest pain, SOB, HA, dizziness, vision change, N/V, diarrhea, constipation, dysuria, urinary urgency or frequency, myalgias, arthralgias or rash.  Objective:   Physical Exam  Vital signs noted  Well developed well nourished male.  HEENT - Head atraumatic Normocephalic                Eyes - PERRLA, Conjuctiva - clear Sclera- Clear EOMI                Ears - EAC's Wnl TM's Wnl Gross Hearing WNL                Nose - Nares patent                 Throat - oropharanx wnl Respiratory - Lungs CTA bilateral Cardiac - RRR S1 and S2 without murmur GI - Abdomen soft Nontender and bowel sounds active x 4 Extremities - No edema. Neuro - Grossly intact.      Assessment & Plan:  Hemorrhoids - Plan: hydrocortisone (PROCTOSOL HC) 2.5 % rectal cream  ACS (acute coronary syndrome) - Plan: metoprolol succinate (TOPROL-XL) 50 MG 24 hr tablet, Ambulatory referral to Cardiology, EKG 12-Lead  Unspecified constipation - Plan: docusate sodium (COLACE) 100 MG capsule  Depressed - Plan: citalopram (CELEXA) 20 MG tablet  Vasovagal syncope and collapse - At end of visit during discussion of plans he becomes warm and Then weak and then lies back onto the exam table.  His color becomes ashen and pale, he has bradycardia with heart rate of 38, becomes diaphoretic, loses radial pulse, becomes unresponsive, Arouses to verbal and the bp is 60/palp, EKG shows SB 38 with nonspecific st depression,  he has some c/o substernal chest discomfort, and then 911 is called, IV started, patient placed on nasal cannula oxygen IV KVO right hand started.  EMS arrives and patient is taken to [facility] [facility] via Ambulance.  Deatra CanterWilliam J Chaia Ikard FNP

## 2013-03-08 NOTE — ED Notes (Signed)
Pt from PCP, c/o near syncopal episode. Pt was receiving a rectal exam for eval of Hemorids. Pt vagel down. Initial bp 84/p. Pt denies loc. Received NS bolus pta. NSD

## 2013-03-08 NOTE — ED Notes (Signed)
Updated Koleen NimrodAdrian RN regarding pt's swallowing eval.

## 2013-03-08 NOTE — Progress Notes (Addendum)
Pt states he did not choke on a cracker but the meat. Diet is regular.

## 2013-03-08 NOTE — ED Provider Notes (Signed)
CSN: [id number]     Arrival date & time 03/08/13  1015 History   First MD Initiated Contact with Patient 03/08/13 1045     Chief Complaint  Patient presents with  . Near Syncope   (Consider location/radiation/quality/duration/timing/severity/associated sxs/prior Treatment) HPI  58 year old male with syncope. Onset shortly before arrival while at his physician's office. Patient was sitting in chair in the exam room when he began feeling like he may pass out. Mild nausea and diaphoresis. Became unresponsive for a period of about a minute. Denies any preceding chest pain. Currently no complaints. Patient with a recent hospitalization for evaluation of chest pain as well as syncope. Cardiac catheterization which showed no significant obstruction. Patient was unwilling to participate further recommended testing such as echocardiogram. He states that he been feeling relatively well since he was discharged. He does walk on a daily basis which he seems to tolerate well. He does continue to smoke. Intermittent compliance with his medications. He has not eating yet today but did have some tea prior to his office visit.  Past Medical History  Diagnosis Date  . Arthritis   . Hyperlipidemia   . Shortness of breath   . Constipation   . Left sided sciatica    Past Surgical History  Procedure Laterality Date  . Cardiac catheterization     Family History  Problem Relation Age of Onset  . Heart disease Mother   . Cancer Father   . Mental illness Sister    History  Substance Use Topics  . Smoking status: Current Every Day Smoker -- 0.50 packs/day for 30 years    Types: Cigars    Start date: 05/21/1979  . Smokeless tobacco: Not on file  . Alcohol Use: Yes     Comment: occ    Review of Systems  All systems reviewed and negative, other than as noted in HPI.   Allergies  Review of patient's allergies indicates no known allergies.  Home Medications   Current Outpatient Rx  Name  Route  Sig   Dispense  Refill  . albuterol (PROVENTIL HFA;VENTOLIN HFA) 108 (90 BASE) MCG/ACT inhaler   Inhalation   Inhale 1-2 puffs into the lungs every 6 (six) hours as needed for shortness of breath.         Marland Kitchen. aspirin 81 MG tablet   Oral   Take 81 mg by mouth daily.         Marland Kitchen. atorvastatin (LIPITOR) 40 MG tablet   Oral   Take 40 mg by mouth at bedtime.         . Cholecalciferol (VITAMIN D) 2000 UNITS CAPS   Oral   Take 2,000 Units by mouth every evening.         . docusate sodium (COLACE) 100 MG capsule   Oral   Take 200 mg by mouth at bedtime.          . Fenofibrate 150 MG CAPS   Oral   Take 150 mg by mouth at bedtime.         . gabapentin (NEURONTIN) 600 MG tablet   Oral   Take 1 tablet (600 mg total) by mouth 3 (three) times daily.   90 tablet   11   . GARLIC PO   Oral   Take 1 capsule by mouth daily.         Marland Kitchen. loratadine (CLARITIN) 10 MG tablet   Oral   Take 10 mg by mouth daily.         .Marland Kitchen  Multiple Vitamin (MULTIVITAMIN WITH MINERALS) TABS tablet   Oral   Take 1 tablet by mouth daily.         . naproxen sodium (ANAPROX) 550 MG tablet   Oral   Take 1 tablet (550 mg total) by mouth 2 (two) times daily with a meal.   60 tablet   3   . traMADol (ULTRAM) 50 MG tablet   Oral   Take 50 mg by mouth 2 (two) times daily.          . citalopram (CELEXA) 20 MG tablet   Oral   Take 1 tablet (20 mg total) by mouth daily.   30 tablet   11   . hydrocortisone (PROCTOSOL HC) 2.5 % rectal cream   Rectal   Place 1 application rectally 2 (two) times daily.   30 g   3   . metoprolol succinate (TOPROL-XL) 50 MG 24 hr tablet   Oral   Take 1 tablet (50 mg total) by mouth daily. Take with or immediately following a meal.   90 tablet   3   . nitroGLYCERIN (NITROSTAT) 0.4 MG SL tablet   Sublingual   Place 0.4 mg under the tongue every 5 (five) minutes as needed for chest pain.          BP 152/94  Pulse 51  Temp(Src) 97.6 F (36.4 C)  Resp 18  SpO2  100% Physical Exam  Nursing note and vitals reviewed. Constitutional: He appears well-developed and well-nourished. No distress.  HENT:  Head: Normocephalic and atraumatic.  Eyes: Conjunctivae are normal. Right eye exhibits no discharge. Left eye exhibits no discharge.  Neck: Neck supple.  Cardiovascular: Normal rate, regular rhythm and normal heart sounds.  Exam reveals no gallop and no friction rub.   No murmur heard. Pulmonary/Chest: Effort normal and breath sounds normal. No respiratory distress.  Abdominal: Soft. He exhibits no distension. There is no tenderness.  Musculoskeletal: He exhibits no edema and no tenderness.  Neurological: He is alert.  Skin: Skin is warm and dry.  Psychiatric: He has a normal mood and affect. His behavior is normal. Thought content normal.    ED Course  Procedures (including critical care time) Labs Review Labs Reviewed  CBC WITH DIFFERENTIAL - Abnormal; Notable for the following:    WBC 11.4 (*)    Neutrophils Relative % 78 (*)    Neutro Abs 8.9 (*)    All other components within normal limits  BASIC METABOLIC PANEL - Abnormal; Notable for the following:    Glucose, Bld 101 (*)    GFR calc non Af Amer 72 (*)    GFR calc Af Amer 84 (*)    All other components within normal limits  TROPONIN I   Imaging Review No results found.  EKG Interpretation    Date/Time:  Monday March 08 2013 10:29:26 EST Ventricular Rate:  54 PR Interval:  179 QRS Duration: 96 QT Interval:  478 QTC Calculation: 453 R Axis:   35 Text Interpretation:  Sinus rhythm Baseline wander in lead(s) I II aVR No significant change since last tracing Confirmed by Shuna Tabor  MD, Reita Shindler ([id number]) on 03/08/2013 1:37:46 PM            MDM   1. Syncope   2. Bradycardia   3. Constipation   4. Dyslipidemia   5. Leukocytosis, unspecified   6. Near syncope     58 year old male with recurrent syncope. Reviewed recent admission records. Patient says he is now agreeable  to  further testing. Will discuss with medicine for possible admission.    Raeford Razor, MD 03/09/13 [phone]

## 2013-03-09 ENCOUNTER — Encounter (HOSPITAL_COMMUNITY): Payer: Self-pay | Admitting: General Practice

## 2013-03-09 ENCOUNTER — Other Ambulatory Visit: Payer: Self-pay | Admitting: Internal Medicine

## 2013-03-09 DIAGNOSIS — R0602 Shortness of breath: Secondary | ICD-10-CM

## 2013-03-09 DIAGNOSIS — R55 Syncope and collapse: Principal | ICD-10-CM

## 2013-03-09 LAB — TROPONIN I

## 2013-03-09 LAB — COMPREHENSIVE METABOLIC PANEL
ALK PHOS: 45 U/L (ref 39–117)
ALT: 17 U/L (ref 0–53)
AST: 19 U/L (ref 0–37)
Albumin: 3.5 g/dL (ref 3.5–5.2)
BILIRUBIN TOTAL: 0.4 mg/dL (ref 0.3–1.2)
BUN: 12 mg/dL (ref 6–23)
CO2: 26 mEq/L (ref 19–32)
CREATININE: 1.04 mg/dL (ref 0.50–1.35)
Calcium: 8.7 mg/dL (ref 8.4–10.5)
Chloride: 105 mEq/L (ref 96–112)
GFR calc Af Amer: 90 mL/min — ABNORMAL LOW (ref >90)
GFR calc non Af Amer: 77 mL/min — ABNORMAL LOW (ref >90)
GLUCOSE: 102 mg/dL — AB (ref 70–99)
Potassium: 3.3 mEq/L — ABNORMAL LOW (ref 3.7–5.3)
Sodium: 143 mEq/L (ref 137–147)
TOTAL PROTEIN: 6 g/dL (ref 6.0–8.3)

## 2013-03-09 LAB — PRO B NATRIURETIC PEPTIDE: PRO B NATRI PEPTIDE: 98.9 pg/mL (ref 0–125)

## 2013-03-09 LAB — CBC WITH DIFFERENTIAL/PLATELET
BASOS ABS: 0.1 10*3/uL (ref 0.0–0.1)
Basophils Relative: 1 % (ref 0–1)
EOS PCT: 10 % — AB (ref 0–5)
Eosinophils Absolute: 0.7 10*3/uL (ref 0.0–0.7)
HCT: 42.1 % (ref 39.0–52.0)
Hemoglobin: 14.6 g/dL (ref 13.0–17.0)
LYMPHS PCT: 32 % (ref 12–46)
Lymphs Abs: 2.2 10*3/uL (ref 0.7–4.0)
MCH: 32.2 pg (ref 26.0–34.0)
MCHC: 34.7 g/dL (ref 30.0–36.0)
MCV: 92.9 fL (ref 78.0–100.0)
MONO ABS: 0.5 10*3/uL (ref 0.1–1.0)
Monocytes Relative: 8 % (ref 3–12)
Neutro Abs: 3.4 10*3/uL (ref 1.7–7.7)
Neutrophils Relative %: 50 % (ref 43–77)
Platelets: 217 10*3/uL (ref 150–400)
RBC: 4.53 MIL/uL (ref 4.22–5.81)
RDW: 13.6 % (ref 11.5–15.5)
WBC: 6.9 10*3/uL (ref 4.0–10.5)

## 2013-03-09 LAB — TSH
TSH: 0.214 u[IU]/mL — ABNORMAL LOW (ref 0.350–4.500)
TSH: 0.208 u[IU]/mL — AB (ref 0.350–4.500)

## 2013-03-09 LAB — LIPID PANEL
CHOLESTEROL: 120 mg/dL (ref 0–200)
HDL: 27 mg/dL — AB (ref >39)
LDL Cholesterol: 73 mg/dL (ref 0–99)
Total CHOL/HDL Ratio: 4.4 RATIO
Triglycerides: 101 mg/dL (ref <150)
VLDL: 20 mg/dL (ref 0–40)

## 2013-03-09 LAB — HEMOGLOBIN A1C
Hgb A1c MFr Bld: 5.9 % — ABNORMAL HIGH (ref <5.7)
Mean Plasma Glucose: 123 mg/dL — ABNORMAL HIGH (ref <117)

## 2013-03-09 LAB — T3, FREE: T3, Free: 2.7 pg/mL (ref 2.3–4.2)

## 2013-03-09 LAB — T4: T4, Total: 5.5 ug/dL (ref 5.0–12.5)

## 2013-03-09 MED ORDER — POTASSIUM CHLORIDE CRYS ER 20 MEQ PO TBCR
40.0000 meq | EXTENDED_RELEASE_TABLET | Freq: Once | ORAL | Status: AC
  Administered 2013-03-09: 40 meq via ORAL
  Filled 2013-03-09: qty 2

## 2013-03-09 MED ORDER — POTASSIUM CHLORIDE IN NACL 20-0.9 MEQ/L-% IV SOLN
INTRAVENOUS | Status: AC
  Administered 2013-03-09 – 2013-03-10 (×2): via INTRAVENOUS
  Filled 2013-03-09 (×2): qty 1000

## 2013-03-09 NOTE — Progress Notes (Signed)
Spoke with PT who just saw pt.  Pt doing well w mobility and is very close to baseline.  Pt does not have OT needs.  Will sign off. Tory EmeraldHolly Nayara Taplin, [place] [phone]

## 2013-03-09 NOTE — Evaluation (Signed)
Clinical/Bedside Swallow Evaluation Patient Details  Name: Frank Gill MRN: [id number] Date of Birth: 1955/08/01  Today's Date: 03/09/2013 Time: 1345-1400 SLP Time Calculation (min): 15 min  Past Medical History:  Past Medical History  Diagnosis Date  . Arthritis   . Hyperlipidemia   . Shortness of breath   . Constipation   . Left sided sciatica   . Vaso vagal episode    Past Surgical History:  Past Surgical History  Procedure Laterality Date  . Cardiac catheterization     HPI:  Frank Gill is a 58 year old male with a past medical history notable for hyperlipidemia, arthritis, and chest pain (normal catheterization 02/2013).  He was evaluated at his PCP on the day of admission for constipation where he had a rectal exam performed.  After the exam, he was sitting on the table and had an episode of dizziness and weakness followed by a brief (1 minute) period of unresponsiveness.  His pulse was noted to be in the 30's.  EMS was called and he was transferred to [facility] for further evaluation.  CBG was 124 on arrival.  TSH 0.214 (free T3 and free T4 pending), lab work otherwise unremarkable.  Echocardiogram is pending this admission (none prior).   Assessment / Plan / Recommendation Clinical Impression  No overt s/s of aspiration or oral/pharyngeal dysphagia. Pt denies difficulty swallowing. He tolerated successive straw sips without difficulty. Mastication is WFL. No f/u with ST at this time.    Aspiration Risk  None    Diet Recommendation Regular;Thin liquid   Liquid Administration via: Cup;Straw Medication Administration: Whole meds with puree Supervision: Patient able to self feed Postural Changes and/or Swallow Maneuvers: Upright 30-60 min after meal    Other  Recommendations Oral Care Recommendations: Oral care BID   Follow Up Recommendations  None    Frequency and Duration        Pertinent Vitals/Pain No pain reported    SLP Swallow Goals  No f/u with ST at this  time   Swallow Study Prior Functional Status  Type of Home: Mobile home Available Help at Discharge: Family    General Date of Onset: 03/09/13 HPI: Frank Gill is a 58 year old male with a past medical history notable for hyperlipidemia, arthritis, and chest pain (normal catheterization 02/2013).  He was evaluated at his PCP on the day of admission for constipation where he had a rectal exam performed.  After the exam, he was sitting on the table and had an episode of dizziness and weakness followed by a brief (1 minute) period of unresponsiveness.  His pulse was noted to be in the 30's.  EMS was called and he was transferred to [facility] for further evaluation.  CBG was 124 on arrival.  TSH 0.214 (free T3 and free T4 pending), lab work otherwise unremarkable.  Echocardiogram is pending this admission (none prior). Type of Study: Bedside swallow evaluation Previous Swallow Assessment: N/A Diet Prior to this Study: Regular;Thin liquids Temperature Spikes Noted: No Respiratory Status: Room air Behavior/Cognition: Alert;Cooperative Oral Cavity - Dentition: Adequate natural dentition Self-Feeding Abilities: Able to feed self Patient Positioning: Upright in bed Baseline Vocal Quality: Clear Volitional Cough: Strong Volitional Swallow: Able to elicit    Oral/Motor/Sensory Function Overall Oral Motor/Sensory Function: Appears within functional limits for tasks assessed   Ice Chips Ice chips: Not tested   Thin Liquid Thin Liquid: Within functional limits Presentation: Cup;Straw    Nectar Thick Nectar Thick Liquid: Not tested   Honey Thick  Honey Thick Liquid: Not tested   Puree Puree: Within functional limits   Solid   GO    Solid: Within functional limits Presentation: Self Fed       Dara HoyerLovvorn, Laura Ann 03/09/2013,2:07 PM

## 2013-03-09 NOTE — Progress Notes (Signed)
*  PRELIMINARY RESULTS* Vascular Ultrasound Carotid Duplex (Doppler) has been completed.   Findings suggest 1-39% internal carotid artery stenosis bilaterally. Vertebral arteries are patent with antegrade flow.  03/09/2013 12:20 PM Gertie FeyMichelle Olis Viverette, RVT, RDCS, RDMS

## 2013-03-09 NOTE — Consult Note (Signed)
ELECTROPHYSIOLOGY CONSULT NOTE    Patient ID: HYKEEM OJEDA MRN: [id number], DOB/AGE: 04/12/1955 58 y.o.   Admit date: 03/08/2013 Date of Consult: 03-09-2013  Primary Physician: [name], MD Primary Cardiologist: Myrtis Ser  Reason for Consultation: syncope  HPI:  Mr. Kruse is a 58 year old male with a past medical history notable for hyperlipidemia, arthritis, and chest pain (normal catheterization 02/2013).  He was evaluated at his PCP on the day of admission for constipation where he had a rectal exam performed.  After the exam, he was sitting on the table and had an episode of dizziness and weakness followed by a brief (1 minute) period of unresponsiveness.  His pulse was noted to be in the 30's.  EMS was called and he was transferred to [facility] for further evaluation.  CBG was 124 on arrival.  TSH 0.214 (free T3 and free T4 pending), lab work otherwise unremarkable.  Echocardiogram this admission demonstrated EF 50-55%, no RWMA, otherwise normal.  Carotid dopplers this admission showed 1-39% carotid stenosis bilaterally.   The patient has on his home medication list Toprol that was prescribed for HTN, but he has not yet gotten this prescription filled.   He has had prior syncope but cannot remember the details of all the spells.  The one he does remember occurred after a bowel movement.   Since admission, he has had no further syncope or pre-syncope.  Telemetry has demonstrated sinus rhythm with no significant brady or tachy arrhythmias.    Today he denies chest pain, shortness of breath, palpitations, or dizziness.   He lives at home with his girlfriend.  He does not work.  He is a current smoker, drinks occasional ETOH, but denies recreational drug use.   EP has been asked to evaluate for treatment options.  ROS is negative except as outlined above.   Past Medical History  Diagnosis Date  . Arthritis   . Hyperlipidemia   . Shortness of breath   . Constipation   . Left sided  sciatica      Surgical History:  Past Surgical History  Procedure Laterality Date  . Cardiac catheterization       Prescriptions prior to admission  Medication Sig Dispense Refill  . albuterol (PROVENTIL HFA;VENTOLIN HFA) 108 (90 BASE) MCG/ACT inhaler Inhale 1-2 puffs into the lungs every 6 (six) hours as needed for shortness of breath.      Marland Kitchen aspirin 81 MG tablet Take 81 mg by mouth daily.      Marland Kitchen atorvastatin (LIPITOR) 40 MG tablet Take 40 mg by mouth at bedtime.      . Cholecalciferol (VITAMIN D) 2000 UNITS CAPS Take 2,000 Units by mouth every evening.      . docusate sodium (COLACE) 100 MG capsule Take 200 mg by mouth at bedtime.       . Fenofibrate 150 MG CAPS Take 150 mg by mouth at bedtime.      . gabapentin (NEURONTIN) 600 MG tablet Take 1 tablet (600 mg total) by mouth 3 (three) times daily.  90 tablet  11  . GARLIC PO Take 1 capsule by mouth daily.      Marland Kitchen loratadine (CLARITIN) 10 MG tablet Take 10 mg by mouth daily.      . Multiple Vitamin (MULTIVITAMIN WITH MINERALS) TABS tablet Take 1 tablet by mouth daily.      . naproxen sodium (ANAPROX) 550 MG tablet Take 1 tablet (550 mg total) by mouth 2 (two) times daily with a meal.  60 tablet  3  . traMADol (ULTRAM) 50 MG tablet Take 50 mg by mouth 2 (two) times daily.       . citalopram (CELEXA) 20 MG tablet Take 1 tablet (20 mg total) by mouth daily.  30 tablet  11  . hydrocortisone (PROCTOSOL HC) 2.5 % rectal cream Place 1 application rectally 2 (two) times daily.  30 g  3  . metoprolol succinate (TOPROL-XL) 50 MG 24 hr tablet Take 1 tablet (50 mg total) by mouth daily. Take with or immediately following a meal.  90 tablet  3  . nitroGLYCERIN (NITROSTAT) 0.4 MG SL tablet Place 0.4 mg under the tongue every 5 (five) minutes as needed for chest pain.        Inpatient Medications:  . aspirin EC  81 mg Oral Daily  . atorvastatin  40 mg Oral QHS  . citalopram  20 mg Oral Daily  . docusate sodium  200 mg Oral QHS  . fenofibrate  160  mg Oral Daily  . gabapentin  600 mg Oral TID  . heparin  5,000 Units Subcutaneous Q8H  . loratadine  10 mg Oral Daily  . multivitamin with minerals  1 tablet Oral Daily  . pantoprazole  40 mg Oral Q0600  . potassium chloride  40 mEq Oral Once  . sodium chloride  3 mL Intravenous Q12H    Allergies: No Known Allergies  History   Social History  . Marital Status: Divorced    Spouse Name: N/A    Number of Children: N/A  . Years of Education: N/A   Occupational History  . Not on file.   Social History Main Topics  . Smoking status: Current Every Day Smoker -- 0.50 packs/day for 30 years    Types: Cigars    Start date: 05/21/1979  . Smokeless tobacco: Not on file  . Alcohol Use: Yes     Comment: occ  . Drug Use: No  . Sexual Activity: Not on file   Other Topics Concern  . Not on file   Social History Narrative  . No narrative on file     Family History  Problem Relation Age of Onset  . Heart disease Mother   . Cancer Father   . Mental illness Sister   . Leukemia Father     Physical Exam: Filed Vitals:   03/09/13 0146 03/09/13 0530 03/09/13 1400 03/09/13 1425  BP: 146/86 145/90 167/102 148/78  Pulse: 99 53 66   Temp:  97.5 F (36.4 C) 97.3 F (36.3 C)   TempSrc:   Oral   Resp:  20 20   Height:      Weight:      SpO2:  99% 99%     GEN- The patient is well appearing, alert and oriented x 3 today.   Head- normocephalic, atraumatic Eyes-  Sclera clear, conjunctiva pink Ears- hearing intact Oropharynx- clear Neck- supple, no JVP Lymph- no cervical lymphadenopathy Lungs- Clear to ausculation bilaterally, normal work of breathing Heart- Regular rate and rhythm, no murmurs, rubs or gallops, PMI not laterally displaced GI- soft, NT, ND, + BS Extremities- no clubbing, cyanosis, or edema MS- no significant deformity or atrophy Skin- no rash or lesion Psych- euthymic mood, full affect Neuro- strength and sensation are intact  Labs:   Lab Results    Component Value Date   WBC 6.9 03/09/2013   HGB 14.6 03/09/2013   HCT 42.1 03/09/2013   MCV 92.9 03/09/2013   PLT 217 03/09/2013  Recent Labs Lab 03/09/13 0115  NA 143  K 3.3*  CL 105  CO2 26  BUN 12  CREATININE 1.04  CALCIUM 8.7  PROT 6.0  BILITOT 0.4  ALKPHOS 45  ALT 17  AST 19  GLUCOSE 102*   Lab Results  Component Value Date   TROPONINI <0.30 03/09/2013   Lab Results  Component Value Date   CHOL 120 03/09/2013   CHOL 125 08/11/2012   Lab Results  Component Value Date   HDL 27* 03/09/2013   HDL 37* 01/04/2013   HDL 28* 08/11/2012   Lab Results  Component Value Date   LDLCALC 73 03/09/2013   LDLCALC 77 01/04/2013   LDLCALC 67 08/11/2012   Lab Results  Component Value Date   TRIG 101 03/09/2013   TRIG 60 01/04/2013   TRIG 151* 08/11/2012   Lab Results  Component Value Date   CHOLHDL 4.4 03/09/2013   CHOLHDL 3.4 01/04/2013   CHOLHDL 4.5 08/11/2012    Radiology/Studies: Dg Chest 2 View 03/08/2013   CLINICAL DATA:  Syncope. Leukocytosis. Smoker. Shortness of breath.  EXAM: CHEST  2 VIEW  COMPARISON:  02/09/2013.  FINDINGS: Normal sized heart with an interval decrease in size. Stable elevation of the left hemidiaphragm were adjacent left basilar linear density. Otherwise, clear lungs. Mild scoliosis.  IMPRESSION: No acute abnormality. Stable elevation of the left hemidiaphragm with adjacent left basilar scarring or atelectasis   Electronically Signed   By: Gordan Payment M.D.   On: 03/08/2013 18:32    ZOX:WRUEA bradycardia, rate 54, normal intervals  TELEMETRY: sinus rhythm/sinus brady (50's), no significant brady or tachy arrhythmias   Assessment and Plan:  1. Syncope History is consistent with vasovagal syncope.  EKG and echo are low risk.  Previous cath is also reviewed. No further inpatient workup is advised.  Per DMV he should not drive for 6 weeks (pt aware). 30 day event monitor will be scheduled through our office.  2. SOB He has occasional SOB/ breathlessness His spouse  reports that he snores and he also has fatigue.  I have spoken with Bill at [facility] office who will arrange for an outpatient sleep study.   EP to see as needed while here.  Please call with questions.

## 2013-03-09 NOTE — Evaluation (Signed)
Physical Therapy Evaluation Patient Details Name: Frank SafeDwight D Liska MRN: [id number] DOB: 01-11-1956 Today's Date: 03/09/2013 Time: [id number] PT Time Calculation (min): 20 min  PT Assessment / Plan / Recommendation History of Present Illness  Pt admitted after near sycopal episode post rectal check at the doctor's office. Suspected vaso vagal episode vs bradycardia.  Pt report he may have inadvertantly taken multiple days of his meds that day.  Clinical Impression  Patient evaluated by Physical Therapy with no further acute PT needs identified. All education has been completed and the patient has no further questions.  See below for any follow-up Physial Therapy or equipment needs. PT is signing off. Thank you for this referral.\    PT Assessment  Patent does not need any further PT services    Follow Up Recommendations  No PT follow up    Does the patient have the potential to tolerate intense rehabilitation      Barriers to Discharge        Equipment Recommendations  None recommended by PT    Recommendations for Other Services     Frequency      Precautions / Restrictions Restrictions Weight Bearing Restrictions: No   Pertinent Vitals/Pain       Mobility  Bed Mobility Overal bed mobility: Independent Transfers Overall transfer level: Independent Ambulation/Gait Ambulation/Gait assistance: Independent Ambulation Distance (Feet): 500 Feet Gait Pattern/deviations: WFL(Within Functional Limits) Gait velocity interpretation: at or above normal speed for age/gender Stairs: Yes Stairs assistance: Independent Stair Management: No rails;One rail Right;Alternating pattern;Forwards Number of Stairs: 4    Exercises     PT Diagnosis:    PT Problem List:   PT Treatment Interventions:       PT Goals(Current goals can be found in the care plan section) Acute Rehab PT Goals PT Goal Formulation: No goals set, d/c therapy  Visit Information  Last PT Received On:  03/09/13 Assistance Needed: +1 History of Present Illness: Pt admitted after near sycopal episode post rectal check at the doctor's office. Suspected vaso vagal episode vs bradycardia.  Pt report he may have inadvertantly taken multiple days of his meds that day.       Prior Functioning  Home Living Family/patient expects to be discharged to:: Private residence Living Arrangements: Spouse/significant other Available Help at Discharge: Family Type of Home: Mobile home Home Access: Stairs to enter Entrance Stairs-Number of Steps: 5 Entrance Stairs-Rails: Right;Left Home Layout: One level Home Equipment: None;Cane - single point Prior Function Level of Independence: Independent;Independent with assistive device(s) Communication Communication: No difficulties    Cognition  Cognition Arousal/Alertness: Awake/alert Behavior During Therapy: WFL for tasks assessed/performed Overall Cognitive Status: Within Functional Limits for tasks assessed    Extremity/Trunk Assessment Upper Extremity Assessment Upper Extremity Assessment: Overall WFL for tasks assessed Lower Extremity Assessment Lower Extremity Assessment: Overall WFL for tasks assessed   Balance Balance Overall balance assessment: Independent  End of Session PT - End of Session Activity Tolerance: Patient tolerated treatment well Patient left: in bed  GP     Alexandar Weisenberger, Eliseo GumKenneth V 03/09/2013, 10:30 AM 03/09/2013  [place] BingKen Melinda Gwinner, PT [phone]  (pager)

## 2013-03-09 NOTE — Progress Notes (Signed)
Pt ambulated in hallway 250 ft independently and tolerated activity well. Will continue to monitor.

## 2013-03-09 NOTE — Progress Notes (Addendum)
Patient Demographics  Frank Gill, is a 58 y.o. male, DOB - Oct 21, 1955, ZOX:[id number]  Admit date - 03/08/2013   Admitting Physician Cleora Fleet, MD  Outpatient Primary MD for the patient is [name], MD  LOS - 1   Chief Complaint  Patient presents with  . Near Syncope        Assessment & Plan    1.Syncope. With bradycardia in the 30s - this happened several minutes after rectal exam, at that time patient was sitting outside in the doctor's office. He also has a recent admission for ACS with left heart catheterization few weeks ago, left heart cath showed normal coronaries. I question if this is truly vasovagal as happened several minutes after the actual rectal exam, patient did not experience any pain at the time of syncope. He was bradycardic intermittent 30s when he passed out, he does not take any rate controlling agents, currently in mid 50s on telemetry. Not orthostatic. Will ambulate him in the hallway to check for chronotropic response, TSH in fact is on the lower side. Will have cardiology evaluate the patient to rule out any possibility of sinus node dysfunction or the need for event monitor. Patient advised not to drive until cleared by cardiology.   Note patient has had a few syncopal episodes before also.   Addendum. Patient later in the pons the nurse that he might have taken his daily medications twice or thrice the same day by mistake, there could be some polypharmacy associated with his symptoms also.     2.Tobacco abuse - The patient was counseled on the dangers of tobacco use, and was advised to quit, referred to a tobacco cessation program and reluctant to quit.     3.Dyslipidemia - stable fasting lipid panel , continue home medications for high  triglycerides.  Lab Results  Component Value Date   CHOL 120 03/09/2013   HDL 27* 03/09/2013   LDLCALC 73 03/09/2013   TRIG 101 03/09/2013   CHOLHDL 4.4 03/09/2013      4. Exertional shortness of breath - smoking cessation advised as above, negative recent left heart cath, stable x-ray. Shortness of breath is resolved. Check echogram to rule out any valvular abnormality.     5. Elevated random glucose in the ER. A1c stable at 5.9.    6. History of depression and PTSD. No acute issues. Not suicidal homicidal. Stable on home medications which are being continued.    7. Low potassium replaced, recheck in the morning.     Code Status: Full  Family Communication:    Disposition Plan: Home   Procedures  echo, carotid duplex   Consults  Cards   Medications  Scheduled Meds: . aspirin EC  81 mg Oral Daily  . atorvastatin  40 mg Oral QHS  . citalopram  20 mg Oral Daily  . docusate sodium  200 mg Oral QHS  . fenofibrate  160 mg Oral Daily  . gabapentin  600 mg Oral TID  . heparin  5,000 Units Subcutaneous Q8H  . loratadine  10 mg Oral Daily  . multivitamin with minerals  1 tablet Oral Daily  . pantoprazole  40 mg Oral Q0600  . potassium chloride  40 mEq Oral Once  .  sodium chloride  3 mL Intravenous Q12H   Continuous Infusions: . 0.9 % NaCl with KCl 20 mEq / L     PRN Meds:.albuterol, HYDROcodone-acetaminophen, hydrocortisone, naproxen  DVT Prophylaxis   Heparin  Lab Results  Component Value Date   PLT 217 03/09/2013    Antibiotics    Anti-infectives   None          Subjective:   Starla Linkwight Zamorano today has, No headache, No chest pain, No abdominal pain - No Nausea, No new weakness tingling or numbness, No Cough - SOB  Objective:   Filed Vitals:   03/09/13 0144 03/09/13 0145 03/09/13 0146 03/09/13 0530  BP: 153/92 138/97 146/86 145/90  Pulse: 105 107 99 53  Temp:    97.5 F (36.4 C)  TempSrc:      Resp:    20  Height:      Weight:      SpO2:    99%     Wt Readings from Last 3 Encounters:  03/08/13 79.3 kg (174 lb 13.2 oz)  03/08/13 75.025 kg (165 lb 6.4 oz)  02/09/13 76.8 kg (169 lb 5 oz)     Intake/Output Summary (Last 24 hours) at 03/09/13 0957 Last data filed at 03/09/13 0600  Gross per 24 hour  Intake 1346.25 ml  Output    525 ml  Net 821.25 ml    Exam Awake Alert, Oriented X 3, No new F.N deficits, Normal affect [place].AT,PERRAL Supple Neck,No JVD, No cervical lymphadenopathy appriciated.  Symmetrical Chest wall movement, Good air movement bilaterally, CTAB RRR,No Gallops,Rubs or new Murmurs, No Parasternal Heave +ve B.Sounds, Abd Soft, Non tender, No organomegaly appriciated, No rebound - guarding or rigidity. No Cyanosis, Clubbing or edema, No new Rash or bruise     Data Review   Micro Results No results found for this or any previous visit (from the past 240 hour(s)).  Radiology Reports Dg Chest 2 View  03/08/2013   CLINICAL DATA:  Syncope. Leukocytosis. Smoker. Shortness of breath.  EXAM: CHEST  2 VIEW  COMPARISON:  02/09/2013.  FINDINGS: Normal sized heart with an interval decrease in size. Stable elevation of the left hemidiaphragm were adjacent left basilar linear density. Otherwise, clear lungs. Mild scoliosis.  IMPRESSION: No acute abnormality. Stable elevation of the left hemidiaphragm with adjacent left basilar scarring or atelectasis   Electronically Signed   By: Gordan PaymentSteve  Farabaugh M.D.   On: 03/08/2013 18:32       CBC  Recent Labs Lab 03/08/13 1122 03/08/13 2020 03/09/13 0115  WBC 11.4* 7.7 6.9  HGB 15.9 14.7 14.6  HCT 45.2 42.2 42.1  PLT 238 218 217  MCV 93.6 93.2 92.9  MCH 32.9 32.5 32.2  MCHC 35.2 34.8 34.7  RDW 13.8 13.7 13.6  LYMPHSABS 1.4  --  2.2  MONOABS 0.7  --  0.5  EOSABS 0.4  --  0.7  BASOSABS 0.0  --  0.1    Chemistries   Recent Labs Lab 03/08/13 1122 03/08/13 2020 03/09/13 0115  NA 142  --  143  K 3.8  --  3.3*  CL 101  --  105  CO2 26  --  26  GLUCOSE 101*  --  102*   BUN 14  --  12  CREATININE 1.10 0.92 1.04  CALCIUM 9.5  --  8.7  MG  --  1.9  --   AST  --   --  19  ALT  --   --  17  ALKPHOS  --   --  45  BILITOT  --   --  0.4   ------------------------------------------------------------------------------------------------------------------ estimated creatinine clearance is 72.4 ml/min (by C-G formula based on Cr of 1.04). ------------------------------------------------------------------------------------------------------------------  Recent Labs  03/08/13 2020  HGBA1C 5.9*   ------------------------------------------------------------------------------------------------------------------  Recent Labs  03/09/13 0115  CHOL 120  HDL 27*  LDLCALC 73  TRIG 161  CHOLHDL 4.4   ------------------------------------------------------------------------------------------------------------------  Recent Labs  03/08/13 2020  TSH 0.214*   ------------------------------------------------------------------------------------------------------------------ No results found for this basename: VITAMINB12, FOLATE, FERRITIN, TIBC, IRON, RETICCTPCT,  in the last 72 hours  Coagulation profile No results found for this basename: INR, PROTIME,  in the last 168 hours  No results found for this basename: DDIMER,  in the last 72 hours  Cardiac Enzymes  Recent Labs Lab 03/08/13 2020 03/09/13 0115 03/09/13 0730  TROPONINI <0.30 <0.30 <0.30   ------------------------------------------------------------------------------------------------------------------ No components found with this basename: POCBNP,      Time Spent in minutes  35   Keston Seever K M.D on 03/09/2013 at 9:57 AM  Between 7am to 7pm - Pager - [phone]  After 7pm go to [email or web address] - password [email or web address]  And look for the night coverage person covering for me after hours  [facility] Office  [phone]

## 2013-03-09 NOTE — Progress Notes (Signed)
Echocardiogram 2D Echocardiogram has been performed.  03/09/2013 12:20 PM Gertie FeyMichelle Tashae Inda, RVT, RDCS, RDMS

## 2013-03-10 DIAGNOSIS — I498 Other specified cardiac arrhythmias: Secondary | ICD-10-CM

## 2013-03-10 DIAGNOSIS — K5900 Constipation, unspecified: Secondary | ICD-10-CM

## 2013-03-10 DIAGNOSIS — F339 Major depressive disorder, recurrent, unspecified: Secondary | ICD-10-CM

## 2013-03-10 LAB — POTASSIUM: POTASSIUM: 3.8 meq/L (ref 3.7–5.3)

## 2013-03-10 NOTE — Discharge Summary (Signed)
Physician Discharge Summary  Frank Gill WUJ:[id number] DOB: 31-May-1955 DOA: 03/08/2013  PCP: [name], MD  Admit date: 03/08/2013 Discharge date: 03/10/2013  Time spent: > 35 minutes  Recommendations for Outpatient Follow-up:  1. Please followup with blood pressures. Consider avoiding beta blockers given her bradycardia while in house 2. Patient to follow up with cardiology for Holter monitor and further evaluation 3. Also patient should have sleep study  Discharge Diagnoses:  Principal Problem:   Vaso vagal episode Active Problems:   DEPRESSION, MAJOR, RECURRENT   POST TRAUMATIC STRESS DISORDER   Syncope   Tobacco abuse   Dyslipidemia   Exertional shortness of breath   Bradycardia   Vasovagal near-syncope   Leukocytosis, unspecified   Hyperglycemia   Constipation   Hypertriglyceridemia   Near syncope   Discharge Condition: Stable  Diet recommendation: Low sodium heart healthy  Filed Weights   03/08/13 1907  Weight: 79.3 kg (174 lb 13.2 oz)    History of present illness:  58 year old with history of hyperlipidemia, arthritis, and recent history of constipation. Presented to the ED with complaints of almost passing out.  Hospital Course:  Principal Problem:   Vaso vagal episode - Resolved PT evaluated patient and recommended no PT for followup - Electrophysiologist consulted and currently episode felt to be vasovagal. Holter monitor to be set up post discharge. - Sleep study to be addressed as well  Active Problems:   DEPRESSION, MAJOR, RECURRENT/POST TRAUMATIC STRESS DISORDER - Stable patient to continue home regimen     Bradycardia - Electrophysiologist recommended Holter monitoring after discharge    Leukocytosis, unspecified - Resolved off of antibiotics. No source of infection found on day of discharge - Patient afebrile    Hyperglycemia - Only mild elevation on last BMP glucose level 102    Constipation - No new complaints on day of  discharge.    Hypertriglyceridemia - Stable continue home regimen  Procedures:  Carotid Doppler reported no hemodynamic significant carotid artery stenosis, vertebral arteries patent with anterograde flow  Echocardiogram within normal limits  Consultations:  Electrophysiologist  Discharge Exam: Filed Vitals:   03/10/13 1105  BP: 172/94  Pulse: 63  Temp:   Resp:     General:Pt in NAD, Alert and awake Cardiovascular: RRR, no MRG Respiratory: CTA BL, no wheezes  Discharge Instructions  Discharge Orders   Future Appointments Provider Department Dept Phone   05/06/2013 10:00 AM Deatra Canter, FNP [facility] [phone]   Future Orders Complete By Expires   Call MD for:  redness, tenderness, or signs of infection (pain, swelling, redness, odor or green/yellow discharge around incision site)  As directed    Call MD for:  severe uncontrolled pain  As directed    Call MD for:  temperature >100.4  As directed    Diet - low sodium heart healthy  As directed    Discharge instructions  As directed    Comments:     Followup with cardiology patient to call [phone] for appointment for plans for Holter monitoring.  Otherwise patient is to followup with his primary care physician for evaluation of his blood pressure further recommendations.  Pt to f/u at Dr. Buel Ream office for sleep study evaluation.   Increase activity slowly  As directed        Medication List    STOP taking these medications       metoprolol succinate 50 MG 24 hr tablet  Commonly known as:  TOPROL-XL  TAKE these medications       albuterol 108 (90 BASE) MCG/ACT inhaler  Commonly known as:  PROVENTIL HFA;VENTOLIN HFA  Inhale 1-2 puffs into the lungs every 6 (six) hours as needed for shortness of breath.     aspirin 81 MG tablet  Take 81 mg by mouth daily.     atorvastatin 40 MG tablet  Commonly known as:  LIPITOR  Take 40 mg by mouth at bedtime.     citalopram 20 MG  tablet  Commonly known as:  CELEXA  Take 1 tablet (20 mg total) by mouth daily.     docusate sodium 100 MG capsule  Commonly known as:  COLACE  Take 200 mg by mouth at bedtime.     Fenofibrate 150 MG Caps  Take 150 mg by mouth at bedtime.     gabapentin 600 MG tablet  Commonly known as:  NEURONTIN  Take 1 tablet (600 mg total) by mouth 3 (three) times daily.     GARLIC PO  Take 1 capsule by mouth daily.     hydrocortisone 2.5 % rectal cream  Commonly known as:  PROCTOSOL HC  Place 1 application rectally 2 (two) times daily.     loratadine 10 MG tablet  Commonly known as:  CLARITIN  Take 10 mg by mouth daily.     multivitamin with minerals Tabs tablet  Take 1 tablet by mouth daily.     naproxen sodium 550 MG tablet  Commonly known as:  ANAPROX  Take 1 tablet (550 mg total) by mouth 2 (two) times daily with a meal.     nitroGLYCERIN 0.4 MG SL tablet  Commonly known as:  NITROSTAT  Place 0.4 mg under the tongue every 5 (five) minutes as needed for chest pain.     traMADol 50 MG tablet  Commonly known as:  ULTRAM  Take 50 mg by mouth 2 (two) times daily.     Vitamin D 2000 UNITS Caps  Take 2,000 Units by mouth every evening.       No Known Allergies    The results of significant diagnostics from this hospitalization (including imaging, microbiology, ancillary and laboratory) are listed below for reference.    Significant Diagnostic Studies: Dg Chest 2 View  03/08/2013   CLINICAL DATA:  Syncope. Leukocytosis. Smoker. Shortness of breath.  EXAM: CHEST  2 VIEW  COMPARISON:  02/09/2013.  FINDINGS: Normal sized heart with an interval decrease in size. Stable elevation of the left hemidiaphragm were adjacent left basilar linear density. Otherwise, clear lungs. Mild scoliosis.  IMPRESSION: No acute abnormality. Stable elevation of the left hemidiaphragm with adjacent left basilar scarring or atelectasis   Electronically Signed   By: Gordan Payment M.D.   On: 03/08/2013 18:32    Dg Chest 2 View  02/08/2013   CLINICAL DATA:  Chest pain  EXAM: CHEST  2 VIEW  COMPARISON:  None.  FINDINGS: Mild elevation of the left hemidiaphragm is noted. There is mild left base atelectasis. There is mild patchy infiltrate in the right base. Elsewhere lungs are clear. Heart size and pulmonary vascularity are normal. No adenopathy. No pneumothorax. There is evidence of an old fracture of the right clavicle, healed.  IMPRESSION: Area of patchy infiltrate right base.  Mild left base atelectasis.   Electronically Signed   By: Bretta Bang M.D.   On: 02/08/2013 18:52   Dg Chest Port 1 View  02/09/2013   CLINICAL DATA:  Shortness of breath.  EXAM: PORTABLE CHEST -  1 VIEW  COMPARISON:  02/08/2013.  FINDINGS: Mediastinum and hilar structures are normal. Stable elevation of left hemidiaphragm is present. Mild atelectasis left lung base. Heart size and pulmonary vascularity normal. No acute bony abnormality .  IMPRESSION: No acute abnormality.  Stable elevation left hemidiaphragm.   Electronically Signed   By: Maisie Fushomas  Register   On: 02/09/2013 13:03    Microbiology: Recent Results (from the past 240 hour(s))  CULTURE, BLOOD (ROUTINE X 2)     Status: None   Collection Time    03/08/13  8:20 PM      Result Value Range Status   Specimen Description BLOOD RIGHT ARM   Final   Special Requests BOTTLES DRAWN AEROBIC AND ANAEROBIC 10CC   Final   Culture  Setup Time     Final   Value: 03/09/2013 01:12     Performed at [facility]   Culture     Final   Value:        BLOOD CULTURE RECEIVED NO GROWTH TO DATE CULTURE WILL BE HELD FOR 5 DAYS BEFORE ISSUING A FINAL NEGATIVE REPORT     Performed at [facility]   Report Status PENDING   Incomplete  CULTURE, BLOOD (ROUTINE X 2)     Status: None   Collection Time    03/08/13  8:25 PM      Result Value Range Status   Specimen Description BLOOD LEFT HAND   Final   Special Requests BOTTLES DRAWN AEROBIC AND ANAEROBIC 3CC   Final   Culture   Setup Time     Final   Value: 03/09/2013 01:12     Performed at [facility]   Culture     Final   Value:        BLOOD CULTURE RECEIVED NO GROWTH TO DATE CULTURE WILL BE HELD FOR 5 DAYS BEFORE ISSUING A FINAL NEGATIVE REPORT     Performed at [facility]   Report Status PENDING   Incomplete     Labs: Basic Metabolic Panel:  Recent Labs Lab 03/08/13 1122 03/08/13 2020 03/09/13 0115 03/10/13 0302  NA 142  --  143  --   K 3.8  --  3.3* 3.8  CL 101  --  105  --   CO2 26  --  26  --   GLUCOSE 101*  --  102*  --   BUN 14  --  12  --   CREATININE 1.10 0.92 1.04  --   CALCIUM 9.5  --  8.7  --   MG  --  1.9  --   --    Liver Function Tests:  Recent Labs Lab 03/09/13 0115  AST 19  ALT 17  ALKPHOS 45  BILITOT 0.4  PROT 6.0  ALBUMIN 3.5   No results found for this basename: LIPASE, AMYLASE,  in the last 168 hours No results found for this basename: AMMONIA,  in the last 168 hours CBC:  Recent Labs Lab 03/08/13 1122 03/08/13 2020 03/09/13 0115  WBC 11.4* 7.7 6.9  NEUTROABS 8.9*  --  3.4  HGB 15.9 14.7 14.6  HCT 45.2 42.2 42.1  MCV 93.6 93.2 92.9  PLT 238 218 217   Cardiac Enzymes:  Recent Labs Lab 03/08/13 1122 03/08/13 2020 03/09/13 0115 03/09/13 0730  TROPONINI <0.30 <0.30 <0.30 <0.30   BNP: BNP (last 3 results)  Recent Labs  03/09/13 0730  PROBNP 98.9   CBG: No results found for this basename:  GLUCAP,  in the last 168 hours     Signed:  Penny Pia  [facility] 03/10/2013, 11:29 AM

## 2013-03-11 ENCOUNTER — Telehealth: Payer: Self-pay | Admitting: Family Medicine

## 2013-03-11 ENCOUNTER — Institutional Professional Consult (permissible substitution): Payer: Medicare HMO | Admitting: Cardiology

## 2013-03-11 NOTE — Progress Notes (Signed)
03/09/13 1406  SLP Time Calculation  SLP Start Time 1345  SLP Stop Time 1400  SLP Time Calculation (min) 15 min  SLP G-Codes **NOT FOR INPATIENT CLASS**  Functional Assessment Tool Used skilled clinical judgement via chart review  Functional Limitations Swallowing  Swallow Current Status (W0981(G8996) [facility]  Sw[facility]8997[facility]  Swallow Discharge Status (W2956(G8998) [facility]  SLP Evaluations  $ SLP Speech Visit 1 Procedure  SLP Evaluations  $BSS Swallow 1 Procedure

## 2013-03-12 ENCOUNTER — Telehealth: Payer: Self-pay | Admitting: Family Medicine

## 2013-03-12 ENCOUNTER — Other Ambulatory Visit: Payer: Self-pay | Admitting: *Deleted

## 2013-03-12 MED ORDER — NITROGLYCERIN 0.4 MG SL SUBL: 0.4000 mg | SUBLINGUAL_TABLET | SUBLINGUAL | Status: DC | PRN

## 2013-03-12 NOTE — Telephone Encounter (Signed)
Please advise 

## 2013-03-12 NOTE — Telephone Encounter (Signed)
Meds have been taken care of. Can referrals be done?

## 2013-03-15 ENCOUNTER — Other Ambulatory Visit: Payer: Self-pay | Admitting: Family Medicine

## 2013-03-15 DIAGNOSIS — R079 Chest pain, unspecified: Secondary | ICD-10-CM

## 2013-03-15 DIAGNOSIS — Z1211 Encounter for screening for malignant neoplasm of colon: Secondary | ICD-10-CM

## 2013-03-15 DIAGNOSIS — R0602 Shortness of breath: Secondary | ICD-10-CM

## 2013-03-15 LAB — CULTURE, BLOOD (ROUTINE X 2)
CULTURE: NO GROWTH
CULTURE: NO GROWTH

## 2013-03-15 MED ORDER — ALBUTEROL SULFATE HFA 108 (90 BASE) MCG/ACT IN AERS: 1.0000 | INHALATION_SPRAY | Freq: Four times a day (QID) | RESPIRATORY_TRACT | Status: DC | PRN

## 2013-03-15 NOTE — Telephone Encounter (Signed)
Consults put in and meds were sent

## 2013-03-15 NOTE — Progress Notes (Addendum)
Late Entry Addendum to Evaluation   03/09/13 1025  PT Time Calculation  PT Start Time 0955  PT Stop Time 1015  PT Time Calculation (min) 20 min  PT G-Codes **NOT FOR INPATIENT CLASS**  Functional Assessment Tool Used clinical judgement  Functional Limitation Mobility: Walking and moving around  Mobility: Walking and Moving Around Current Status [phone](G8978) [facility]  Mobility: Walking and Moving Around Goal Status [phone](G8979) [facility]  Mobility: Walking and Moving Around Discharge Status [phone][facility]G8980) [facility]  PT General Charges  $$ ACUTE PT VISIT 1 Procedure  PT Evaluation  $Initial PT Evaluation Tier I 1 Procedure  PT Treatments  $Gait Training 8-22 mins   03/15/2013  [place] BingKen Kerry Chisolm, PT [phone]  (pager)

## 2013-03-17 ENCOUNTER — Encounter (INDEPENDENT_AMBULATORY_CARE_PROVIDER_SITE_OTHER): Payer: Medicare HMO

## 2013-03-17 ENCOUNTER — Encounter: Payer: Self-pay | Admitting: *Deleted

## 2013-03-17 DIAGNOSIS — R55 Syncope and collapse: Secondary | ICD-10-CM

## 2013-03-17 NOTE — Progress Notes (Signed)
Patient ID: Frank Gill, male   DOB: 20-Oct-1955, 58 y.o.   MRN: [id number] [employer] 30 day cardiac event monitor applied to patient.

## 2013-03-18 ENCOUNTER — Encounter: Payer: Self-pay | Admitting: Gastroenterology

## 2013-03-18 ENCOUNTER — Ambulatory Visit: Payer: Medicare HMO | Admitting: Family Medicine

## 2013-03-22 ENCOUNTER — Ambulatory Visit: Payer: Medicare HMO | Admitting: Family Medicine

## 2013-03-24 ENCOUNTER — Ambulatory Visit: Payer: Medicare HMO | Admitting: Family Medicine

## 2013-03-30 ENCOUNTER — Telehealth: Payer: Self-pay | Admitting: Internal Medicine

## 2013-03-30 NOTE — Telephone Encounter (Signed)
New Problem:  Pt states the electrodes from his heart monitor are causing his skin to be very red and irritated. Pt would like to speak to the nurse

## 2013-03-31 ENCOUNTER — Ambulatory Visit (INDEPENDENT_AMBULATORY_CARE_PROVIDER_SITE_OTHER): Payer: Medicare HMO | Admitting: Emergency Medicine

## 2013-03-31 ENCOUNTER — Encounter: Payer: Self-pay | Admitting: Emergency Medicine

## 2013-03-31 VITALS — BP 140/84 | HR 74 | Ht 68.0 in | Wt 165.6 lb

## 2013-03-31 DIAGNOSIS — R0600 Dyspnea, unspecified: Secondary | ICD-10-CM | POA: Insufficient documentation

## 2013-03-31 DIAGNOSIS — R0609 Other forms of dyspnea: Secondary | ICD-10-CM

## 2013-03-31 DIAGNOSIS — R0989 Other specified symptoms and signs involving the circulatory and respiratory systems: Secondary | ICD-10-CM

## 2013-03-31 NOTE — Assessment & Plan Note (Signed)
Suspect COPD. 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 CXR with some LLL atx vs scar.  - will start w/u with PFT here - order PSG - discussed smoking cessation - rov next available

## 2013-03-31 NOTE — Progress Notes (Signed)
Subjective:    Patient ID: Frank Gill, male    DOB: 1955/10/04, 58 y.o.   MRN: [id number]  HPI 58 yo smoker (40 pk-yrs), hx of hyperlipidemia, arthritis, recent admission and evaluation for syncope. He is referred today by Dr Christell ConstantMoore for dyspnea and to assess whether lung fxn relates to his syncope. His functional capacity is stable, but he notices baseline SOB. He coughs frequently, productive of minimal phlegm. He hears wheezing when he breaths.  He has albuterol ordered but doesn't have a script yet.    Review of Systems  Constitutional: Negative for fever and unexpected weight change.  HENT: Positive for congestion and postnasal drip. Negative for dental problem, ear pain, nosebleeds, rhinorrhea, sinus pressure, sneezing, sore throat and trouble swallowing.   Eyes: Negative for redness and itching.  Respiratory: Positive for cough, chest tightness and shortness of breath. Negative for wheezing.   Cardiovascular: Negative for palpitations and leg swelling.  Gastrointestinal: Negative for nausea and vomiting.  Genitourinary: Negative for dysuria.  Musculoskeletal: Negative for joint swelling.  Skin: Negative for rash.  Neurological: Positive for light-headedness. Negative for headaches.  Hematological: Does not bruise/bleed easily.  Psychiatric/Behavioral: Negative for dysphoric mood. The patient is not nervous/anxious.    Past Medical History  Diagnosis Date  . Arthritis   . Hyperlipidemia   . Shortness of breath   . Constipation   . Left sided sciatica   . Vaso vagal episode      Family History  Problem Relation Age of Onset  . Heart disease Mother   . Cancer Father   . Mental illness Sister   . 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  No military Has lived in [place] and [place].   No Known Allergies   Outpatient Prescriptions Prior to Visit  Medication Sig Dispense Refill  .  albuterol (PROVENTIL HFA;VENTOLIN HFA) 108 (90 BASE) MCG/ACT inhaler Inhale 1-2 puffs into the lungs every 6 (six) hours as needed for shortness of breath.  1 Inhaler  3  . aspirin 81 MG tablet Take 81 mg by mouth daily.      Marland Kitchen. atorvastatin (LIPITOR) 40 MG tablet Take 40 mg by mouth at bedtime.      . Cholecalciferol (VITAMIN D) 2000 UNITS CAPS Take 2,000 Units by mouth every evening.      . citalopram (CELEXA) 20 MG tablet Take 1 tablet (20 mg total) by mouth daily.  30 tablet  11  . docusate sodium (COLACE) 100 MG capsule Take 200 mg by mouth at bedtime.       . Fenofibrate 150 MG CAPS Take 150 mg by mouth at bedtime.      . gabapentin (NEURONTIN) 600 MG tablet Take 1 tablet (600 mg total) by mouth 3 (three) times daily.  90 tablet  11  . GARLIC PO Take 1 capsule by mouth daily.      . hydrocortisone (PROCTOSOL HC) 2.5 % rectal cream Place 1 application rectally 2 (two) times daily.  30 g  3  . loratadine (CLARITIN) 10 MG tablet Take 10 mg by mouth daily.      . Multiple Vitamin (MULTIVITAMIN WITH MINERALS) TABS tablet Take 1 tablet by mouth daily.      . naproxen sodium (ANAPROX) 550 MG tablet Take 1 tablet (550 mg total) by mouth 2 (two) times daily with a meal.  60 tablet  3  .  nitroGLYCERIN (NITROSTAT) 0.4 MG SL tablet Place 1 tablet (0.4 mg total) under the tongue every 5 (five) minutes as needed for chest pain.  30 tablet  2  . traMADol (ULTRAM) 50 MG tablet Take 50 mg by mouth 2 (two) times daily.        No facility-administered medications prior to visit.       Objective:   Physical Exam Filed Vitals:   03/31/13 1512  BP: 140/84  Pulse: 74  Height: 5\' 8"  (1.727 m)  Weight: 165 lb 9.6 oz (75.116 kg)  SpO2: 95%   Gen: Pleasant, well-nourished, in no distress,  normal affect  ENT: No lesions,  mouth clear,  oropharynx clear, no postnasal drip  Neck: No JVD, no TMG, no carotid bruits  Lungs: No use of accessory muscles, distant, clear without rales or  rhonchi  Cardiovascular: RRR, heart sounds normal, no murmur or gallops, no peripheral edema  Musculoskeletal: No deformities, no cyanosis or clubbing  Neuro: alert, non focal  Skin: Warm, he has erythema and raised rash at each lead sticker from his event monitor     Assessment & Plan:  Dyspnea Suspect COPD. 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 CXR with some LLL atx vs scar.  - will start w/u with PFT here - order PSG - discussed smoking cessation - rov next available

## 2013-03-31 NOTE — Patient Instructions (Signed)
Please take the Event monitor stickers off until you have an opportunity to talk to the Cardiology Office. There may be another kind of lead that can be used.  We will schedule [facility] We will perform full breathing tests at your next office visit We discuss smoking today. You will need to work on stopping. We will help you stop.  Follow with Dr Delton CoombesByrum next available with full PFT

## 2013-04-02 ENCOUNTER — Telehealth: Payer: Self-pay | Admitting: Emergency Medicine

## 2013-04-02 NOTE — Telephone Encounter (Signed)
Error. Wrong patient.

## 2013-04-12 ENCOUNTER — Institutional Professional Consult (permissible substitution): Payer: Medicare HMO | Admitting: Internal Medicine

## 2013-04-19 ENCOUNTER — Ambulatory Visit (INDEPENDENT_AMBULATORY_CARE_PROVIDER_SITE_OTHER): Payer: Medicare HMO | Admitting: Gastroenterology

## 2013-04-19 ENCOUNTER — Encounter: Payer: Self-pay | Admitting: Gastroenterology

## 2013-04-19 VITALS — BP 164/94 | HR 60 | Ht 65.0 in | Wt 164.4 lb

## 2013-04-19 DIAGNOSIS — K6289 Other specified diseases of anus and rectum: Secondary | ICD-10-CM

## 2013-04-19 DIAGNOSIS — K5900 Constipation, unspecified: Secondary | ICD-10-CM

## 2013-04-19 MED ORDER — PEG-KCL-NACL-NASULF-NA ASC-C 100 G PO SOLR: 1.0000 | Freq: Once | ORAL | Status: DC

## 2013-04-19 NOTE — Patient Instructions (Signed)
You have been scheduled for a colonoscopy with propofol. Please follow written instructions given to you at your visit today.  Please pick up your prep kit at the pharmacy within the next 1-3 days. If you use inhalers (even only as needed), please bring them with you on the day of your procedure.  You have been given information on constipation.  High-Fiber Diet Fiber is found in fruits, vegetables, and grains. A high-fiber diet encourages the addition of more whole grains, legumes, fruits, and vegetables in your diet. The recommended amount of fiber for adult males is 38 g per day. For adult females, it is 25 g per day. Pregnant and lactating women should get 28 g of fiber per day. If you have a digestive or bowel problem, ask your caregiver for advice before adding high-fiber foods to your diet. Eat a variety of high-fiber foods instead of only a select few type of foods.  PURPOSE  To increase stool bulk.  To make bowel movements more regular to prevent constipation.  To lower cholesterol.  To prevent overeating. WHEN IS THIS DIET USED?  It may be used if you have constipation and hemorrhoids.  It may be used if you have uncomplicated diverticulosis (intestine condition) and irritable bowel syndrome.  It may be used if you need help with weight management.  It may be used if you want to add it to your diet as a protective measure against atherosclerosis, diabetes, and cancer. SOURCES OF FIBER  Whole-grain breads and cereals.  Fruits, such as apples, oranges, bananas, berries, prunes, and pears.  Vegetables, such as green peas, carrots, sweet potatoes, beets, broccoli, cabbage, spinach, and artichokes.  Legumes, such split peas, soy, lentils.  Almonds. FIBER CONTENT IN FOODS Starches and Grains / Dietary Fiber (g)  Cheerios, 1 cup / 3 g  Corn Flakes cereal, 1 cup / 0.7 g  Rice crispy treat cereal, 1 cup / 0.3 g  Instant oatmeal (cooked),  cup / 2 g  Frosted wheat  cereal, 1 cup / 5.1 g  Brown, long-grain rice (cooked), 1 cup / 3.5 g  White, long-grain rice (cooked), 1 cup / 0.6 g  Enriched macaroni (cooked), 1 cup / 2.5 g Legumes / Dietary Fiber (g)  Baked beans (canned, plain, or vegetarian),  cup / 5.2 g  Kidney beans (canned),  cup / 6.8 g  Pinto beans (cooked),  cup / 5.5 g Breads and Crackers / Dietary Fiber (g)  Plain or honey graham crackers, 2 squares / 0.7 g  Saltine crackers, 3 squares / 0.3 g  Plain, salted pretzels, 10 pieces / 1.8 g  Whole-wheat bread, 1 slice / 1.9 g  White bread, 1 slice / 0.7 g  Raisin bread, 1 slice / 1.2 g  Plain bagel, 3 oz / 2 g  Flour tortilla, 1 oz / 0.9 g  Corn tortilla, 1 small / 1.5 g  Hamburger or hotdog bun, 1 small / 0.9 g Fruits / Dietary Fiber (g)  Apple with skin, 1 medium / 4.4 g  Sweetened applesauce,  cup / 1.5 g  Banana,  medium / 1.5 g  Grapes, 10 grapes / 0.4 g  Orange, 1 small / 2.3 g  Raisin, 1.5 oz / 1.6 g  Melon, 1 cup / 1.4 g Vegetables / Dietary Fiber (g)  Green beans (canned),  cup / 1.3 g  Carrots (cooked),  cup / 2.3 g  Broccoli (cooked),  cup / 2.8 g  Peas (cooked),  cup /  4.4 g  Mashed potatoes,  cup / 1.6 g  Lettuce, 1 cup / 0.5 g  Corn (canned),  cup / 1.6 g  Tomato,  cup / 1.1 g Document Released: 01/21/2005 Document Revised: 07/23/2011 Document Reviewed: 04/25/2011 [facility] Patient Information 2014 [place], [place].   Thank you for choosing me and [facility].  Pricilla Riffle. Dagoberto Ligas., MD., Marval Regal

## 2013-04-19 NOTE — Progress Notes (Signed)
    History of Present Illness: This is a 58 year old male who relates the onset of rectal pain and rectal swelling. The pain is described as a burning pain worsening with bowel movements. Symptoms began in February. He had an unremarkable rectal examination by his PCP in early February and immediately following the DRE had a syncopal episode and was hospitalized. It was determined that he had vasovagal syncope. He's been treated with Proctosol HC cream for the past few weeks and his rectal pain and swelling have resolved. He states he has  constipation for many years and has a bowel movement about every 2-3 days with frequent straining. He takes Colace on a daily basis. Denies weight loss, abdominal pain, diarrhea, change in stool caliber, melena, hematochezia, nausea, vomiting, dysphagia, reflux symptoms, chest pain.  Review of Systems: Pertinent positive and negative review of systems were noted in the above HPI section. All other review of systems were otherwise negative.  Current Medications, Allergies, Past Medical History, Past Surgical History, Family History and Social History were reviewed in [facility] electronic medical record.  Physical Exam: General: Well developed , well nourished, no acute distress Head: Normocephalic and atraumatic Eyes:  sclerae anicteric, EOMI Ears: Normal auditory acuity Mouth: No deformity or lesions Neck: Supple, no masses or thyromegaly Lungs: Clear throughout to auscultation Heart: Regular rate and rhythm; no murmurs, rubs or bruits Abdomen: Soft, non tender and non distended. No masses, hepatosplenomegaly or hernias noted. Normal Bowel sounds Rectal: Deferred to colonoscopy, recent DRE unremarkable Musculoskeletal: Symmetrical with no gross deformities  Skin: No lesions on visible extremities Pulses:  Normal pulses noted Extremities: No clubbing, cyanosis, edema or deformities noted Neurological: Alert oriented x 4, grossly nonfocal Cervical  Nodes:  No significant cervical adenopathy Inguinal Nodes: No significant inguinal adenopathy Psychological:  Alert and cooperative. Normal mood and affect  Assessment and Recommendations:  1. Chronic constipation and resolved rectal pain and swelling. Suspected hemorrhoidal symptoms. Rule out colorectal neoplasms and other disorders. Increase dietary fiber and water intake. Continue Colace. Schedule colonoscopy. The risks, benefits, and alternatives to colonoscopy with possible biopsy and possible polypectomy were discussed with the patient and they consent to proceed.

## 2013-04-21 ENCOUNTER — Telehealth: Payer: Self-pay | Admitting: Gastroenterology

## 2013-04-21 DIAGNOSIS — R1319 Other dysphagia: Secondary | ICD-10-CM

## 2013-04-21 NOTE — Telephone Encounter (Signed)
Patient reports that he is having food dysphagia on a daily basis.  Worse with solids then liquids.  He is asking about adding on an EGD.  OK?

## 2013-04-21 NOTE — Telephone Encounter (Signed)
Schedule BA esophagram. If this shows problems that require an EGD we can schedule with his colonoscopy but if EGD is required it will need to be scheduled when there is enough time to allow both procedures or they can be performed on separate days.

## 2013-04-22 ENCOUNTER — Other Ambulatory Visit: Payer: Self-pay | Admitting: Gastroenterology

## 2013-04-22 NOTE — Telephone Encounter (Signed)
Patient is scheduled for BS for for 04/27/13 9:45 arrival for 10:00.

## 2013-04-23 ENCOUNTER — Encounter: Payer: Self-pay | Admitting: Gastroenterology

## 2013-04-27 ENCOUNTER — Ambulatory Visit (HOSPITAL_COMMUNITY): Admission: RE | Admit: 2013-04-27 | Payer: Medicare HMO | Source: Ambulatory Visit

## 2013-04-27 ENCOUNTER — Other Ambulatory Visit: Payer: Self-pay | Admitting: Family Medicine

## 2013-04-30 ENCOUNTER — Inpatient Hospital Stay (HOSPITAL_COMMUNITY): Admission: RE | Admit: 2013-04-30 | Payer: Medicare HMO | Source: Ambulatory Visit

## 2013-05-06 ENCOUNTER — Ambulatory Visit (INDEPENDENT_AMBULATORY_CARE_PROVIDER_SITE_OTHER): Payer: Medicare HMO | Admitting: Family Medicine

## 2013-05-06 ENCOUNTER — Encounter: Payer: Self-pay | Admitting: Family Medicine

## 2013-05-06 VITALS — BP 135/78 | HR 59 | Temp 97.0°F | Ht 68.0 in | Wt 164.6 lb

## 2013-05-06 DIAGNOSIS — K5900 Constipation, unspecified: Secondary | ICD-10-CM

## 2013-05-06 DIAGNOSIS — J302 Other seasonal allergic rhinitis: Secondary | ICD-10-CM

## 2013-05-06 DIAGNOSIS — F329 Major depressive disorder, single episode, unspecified: Secondary | ICD-10-CM

## 2013-05-06 DIAGNOSIS — E785 Hyperlipidemia, unspecified: Secondary | ICD-10-CM

## 2013-05-06 DIAGNOSIS — F32A Depression, unspecified: Secondary | ICD-10-CM

## 2013-05-06 DIAGNOSIS — J4489 Other specified chronic obstructive pulmonary disease: Secondary | ICD-10-CM

## 2013-05-06 DIAGNOSIS — F3289 Other specified depressive episodes: Secondary | ICD-10-CM

## 2013-05-06 DIAGNOSIS — K649 Unspecified hemorrhoids: Secondary | ICD-10-CM

## 2013-05-06 DIAGNOSIS — J449 Chronic obstructive pulmonary disease, unspecified: Secondary | ICD-10-CM

## 2013-05-06 DIAGNOSIS — J309 Allergic rhinitis, unspecified: Secondary | ICD-10-CM

## 2013-05-06 DIAGNOSIS — M549 Dorsalgia, unspecified: Secondary | ICD-10-CM

## 2013-05-06 MED ORDER — DOCUSATE SODIUM 100 MG PO CAPS: 200.0000 mg | ORAL_CAPSULE | Freq: Every day | ORAL | Status: DC

## 2013-05-06 MED ORDER — TRAMADOL HCL 50 MG PO TABS: 50.0000 mg | ORAL_TABLET | Freq: Two times a day (BID) | ORAL | Status: DC

## 2013-05-06 MED ORDER — FENOFIBRATE 150 MG PO CAPS: 150.0000 mg | ORAL_CAPSULE | Freq: Every day | ORAL | Status: DC

## 2013-05-06 MED ORDER — GABAPENTIN 600 MG PO TABS: 600.0000 mg | ORAL_TABLET | Freq: Three times a day (TID) | ORAL | Status: DC

## 2013-05-06 MED ORDER — ALBUTEROL SULFATE HFA 108 (90 BASE) MCG/ACT IN AERS: 1.0000 | INHALATION_SPRAY | Freq: Four times a day (QID) | RESPIRATORY_TRACT | Status: DC | PRN

## 2013-05-06 MED ORDER — ATORVASTATIN CALCIUM 40 MG PO TABS
40.0000 mg | ORAL_TABLET | Freq: Every day | ORAL | Status: DC
Start: 2013-05-06 — End: 2014-01-19

## 2013-05-06 MED ORDER — NAPROXEN SODIUM 550 MG PO TABS: 550.0000 mg | ORAL_TABLET | Freq: Two times a day (BID) | ORAL | Status: DC

## 2013-05-06 MED ORDER — CITALOPRAM HYDROBROMIDE 20 MG PO TABS: 20.0000 mg | ORAL_TABLET | Freq: Every day | ORAL | Status: DC

## 2013-05-06 MED ORDER — LORATADINE 10 MG PO TABS: 10.0000 mg | ORAL_TABLET | Freq: Every day | ORAL | Status: DC

## 2013-05-06 MED ORDER — HYDROCORTISONE 2.5 % RE CREA: 1.0000 "application " | TOPICAL_CREAM | Freq: Two times a day (BID) | RECTAL | Status: DC

## 2013-05-06 MED ORDER — POLYETHYLENE GLYCOL 3350 17 GM/SCOOP PO POWD: 17.0000 g | Freq: Two times a day (BID) | ORAL | Status: DC | PRN

## 2013-05-06 NOTE — Progress Notes (Signed)
   Subjective:    Patient ID: Frank Gill, male    DOB: 11/11/1955, 58 y.o.   MRN: [id number]  HPI This 58 y.o. male presents for evaluation of routine follow up.  He was recently seen by cardiology For syncopal episode and chest pain and he was cleared by cardiology.  He has been seeing Pulmonary for COPD.  He has recently seen GI.  He needs refills on meds.  His hemorrhoids and  Constipation have been bothering him.   Review of Systems No chest pain, SOB, HA, dizziness, vision change, N/V, diarrhea, constipation, dysuria, urinary urgency or frequency, myalgias, arthralgias or rash.     Objective:   Physical Exam  Vital signs noted  Well developed well nourished male.  HEENT - Head atraumatic Normocephalic                Eyes - PERRLA, Conjuctiva - clear Sclera- Clear EOMI                Ears - EAC's Wnl TM's Wnl Gross Hearing WNL                Nose - Nares patent                 Throat - oropharanx wnl Respiratory - Lungs CTA bilateral Cardiac - RRR S1 and S2 without murmur GI - Abdomen soft Nontender and bowel sounds active x 4 Extremities - No edema. Neuro - Grossly intact.      Assessment & Plan:  Hemorrhoids - Plan: hydrocortisone (PROCTOSOL HC) 2.5 % rectal cream  Back pain with radiation - Plan: traMADol (ULTRAM) 50 MG tablet, gabapentin (NEURONTIN) 600 MG tablet, naproxen sodium (ANAPROX) 550 MG tablet  Depressed - Plan: citalopram (CELEXA) 20 MG tablet  Unspecified constipation - Plan: polyethylene glycol powder (GLYCOLAX/MIRALAX) powder, docusate sodium (COLACE) 100 MG capsule  Seasonal allergies - Plan: loratadine (CLARITIN) 10 MG tablet  COPD (chronic obstructive pulmonary disease) - Plan: albuterol (PROVENTIL HFA;VENTOLIN HFA) 108 (90 BASE) MCG/ACT inhaler  Other and unspecified hyperlipidemia - Plan: atorvastatin (LIPITOR) 40 MG tablet, Fenofibrate 150 MG CAPS

## 2013-05-13 ENCOUNTER — Ambulatory Visit: Payer: Medicare HMO | Admitting: Emergency Medicine

## 2013-05-14 ENCOUNTER — Institutional Professional Consult (permissible substitution): Payer: Medicare HMO | Admitting: Internal Medicine

## 2013-05-17 ENCOUNTER — Encounter: Payer: Self-pay | Admitting: Internal Medicine

## 2013-05-19 ENCOUNTER — Institutional Professional Consult (permissible substitution): Payer: Medicare HMO | Admitting: Internal Medicine

## 2013-06-01 ENCOUNTER — Encounter: Payer: Medicare HMO | Admitting: Gastroenterology

## 2013-06-01 ENCOUNTER — Telehealth: Payer: Self-pay | Admitting: Gastroenterology

## 2013-06-01 ENCOUNTER — Telehealth: Payer: Self-pay | Admitting: Emergency Medicine

## 2013-06-01 NOTE — Telephone Encounter (Signed)
Spoke with Toniann FailWendy- she states patient had Holter event and has never heard back about the results-states Cardiology MD did this. I explained to the patient that she would need to call them at [phone] and ask for results. I also explained to Toniann FailWendy that the patient needs to get DPR on file for us to be able to speak with her about patients health. Toniann FailWendy understands and will let patient know.

## 2013-06-01 NOTE — Telephone Encounter (Signed)
Yes charge [facility] No Show fee

## 2013-06-08 ENCOUNTER — Telehealth: Payer: Self-pay | Admitting: *Deleted

## 2013-06-08 NOTE — Telephone Encounter (Signed)
I do not know anything about this message. Did I ever receive it? I am not Dr Jenel LucksAllred's nurse.

## 2013-06-23 ENCOUNTER — Institutional Professional Consult (permissible substitution): Payer: Medicare HMO | Admitting: Internal Medicine

## 2013-07-01 ENCOUNTER — Encounter: Payer: Self-pay | Admitting: Internal Medicine

## 2013-08-09 ENCOUNTER — Other Ambulatory Visit: Payer: Self-pay

## 2013-08-11 ENCOUNTER — Ambulatory Visit (INDEPENDENT_AMBULATORY_CARE_PROVIDER_SITE_OTHER): Payer: Medicare HMO | Admitting: Internal Medicine

## 2013-08-11 ENCOUNTER — Encounter: Payer: Self-pay | Admitting: Internal Medicine

## 2013-08-11 VITALS — BP 120/78 | HR 57 | Ht 68.0 in | Wt 161.4 lb

## 2013-08-11 DIAGNOSIS — I498 Other specified cardiac arrhythmias: Secondary | ICD-10-CM

## 2013-08-11 DIAGNOSIS — R079 Chest pain, unspecified: Secondary | ICD-10-CM

## 2013-08-11 DIAGNOSIS — R0602 Shortness of breath: Secondary | ICD-10-CM

## 2013-08-11 DIAGNOSIS — R001 Bradycardia, unspecified: Secondary | ICD-10-CM

## 2013-08-11 MED ORDER — NITROGLYCERIN 0.4 MG SL SUBL: 0.4000 mg | SUBLINGUAL_TABLET | SUBLINGUAL | Status: DC | PRN

## 2013-08-11 NOTE — Progress Notes (Signed)
PCP:  Rudi HeapMOORE, DONALD, MD  The patient presents today for routine cardiology followup.  I saw him 2/15 with vasovagal syncope.  He has had no further syncope and is doing well from this standpoint.  He presents today for evaluation of chest pain.  He reports that several days ago while doing yard work that he developed moderate intensity substernal chest pain.  This lasting about 20 minutes and resolved with rest.  He denies associated symptoms and has had no further episodes.  He has SOB which is unchanged in character from its chronic nature.  He continues to smoke despite being today to quite numerous times.  He previously saw Dr Delton CoombesByrum and was encouraged to have PFTs and a sleep study but has been noncompliant with this.--> I have encouraged him to follow-up with Dr Delton CoombesByrum regarding this.   Today, he denies symptoms of palpitations,  orthopnea, PND, lower extremity edema, dizziness, presyncope, further syncope, or neurologic sequela.  The patient feels that he is tolerating medications without difficulties and is otherwise without complaint today.   Past Medical History  Diagnosis Date  . Arthritis   . Hyperlipidemia   . Shortness of breath   . Constipation   . Left sided sciatica   . Vaso vagal episode   . Near syncope   . Syncope   . Dyslipidemia   . Exertional shortness of breath   . Dyspnea   . PTSD (post-traumatic stress disorder)   . Major depression, recurrent   . Leukocytosis, unspecified   . Hyperglycemia   . Abscess of back   . Bradycardia    Past Surgical History  Procedure Laterality Date  . Cardiac catheterization      Current Outpatient Prescriptions  Medication Sig Dispense Refill  . albuterol (PROVENTIL HFA;VENTOLIN HFA) 108 (90 BASE) MCG/ACT inhaler Inhale 1-2 puffs into the lungs every 6 (six) hours as needed for shortness of breath.  1 Inhaler  11  . aspirin 81 MG tablet Take 81 mg by mouth daily.      Marland Kitchen. atorvastatin (LIPITOR) 40 MG tablet Take 1 tablet (40 mg  total) by mouth daily at 6 PM.  30 tablet  11  . Cholecalciferol (VITAMIN D) 2000 UNITS CAPS Take 2,000 Units by mouth every evening.      . citalopram (CELEXA) 20 MG tablet Take 1 tablet (20 mg total) by mouth daily.  30 tablet  11  . docusate sodium (COLACE) 100 MG capsule Take 200 mg by mouth as needed.      . Fenofibrate 150 MG CAPS Take 1 capsule (150 mg total) by mouth at bedtime.  30 each  11  . gabapentin (NEURONTIN) 600 MG tablet Take 1 tablet (600 mg total) by mouth 3 (three) times daily.  90 tablet  11  . hydrocortisone (PROCTOSOL HC) 2.5 % rectal cream Place 1 application rectally 2 (two) times daily.  30 g  11  . loratadine (CLARITIN) 10 MG tablet Take 1 tablet (10 mg total) by mouth daily.  30 tablet  11  . Multiple Vitamin (MULTIVITAMIN WITH MINERALS) TABS tablet Take 1 tablet by mouth daily.      . traMADol (ULTRAM) 50 MG tablet Take 1 tablet (50 mg total) by mouth 2 (two) times daily.  60 tablet  3  . nitroGLYCERIN (NITROSTAT) 0.4 MG SL tablet Place 1 tablet (0.4 mg total) under the tongue every 5 (five) minutes as needed for chest pain.  30 tablet  2   No current  facility-administered medications for this visit.    Allergies  Allergen Reactions  . Poison Oak Extract [Extract Of Poison Oak]     History   Social History  . Marital Status: Divorced    Spouse Name: N/A    Number of Children: 2  . Years of Education: N/A   Occupational History  . Not on file.   Social History Main Topics  . Smoking status: Current Every Day Smoker -- 0.50 packs/day for 30 years    Types: Cigars, Cigarettes    Start date: 05/21/1979  . Smokeless tobacco: Never Used     Comment: uses e-sig  . Alcohol Use: Yes     Comment: occ  . Drug Use: No  . Sexual Activity: Not on file   Other Topics Concern  . Not on file   Social History Narrative  . No narrative on file    Family History  Problem Relation Age of Onset  . Heart disease Mother   . Mental illness Sister   . Leukemia  Father     ROS-  All systems are reviewed and are negative except as outlined in the HPI above  Physical Exam: Filed Vitals:   08/11/13 1026  BP: 158/97  Pulse: 57  Height: 5\' 8"  (1.727 m)  Weight: 161 lb 6.4 oz (73.211 kg)    GEN- The patient is well appearing, alert and oriented x 3 today.   Head- normocephalic, atraumatic Eyes-  Sclera clear, conjunctiva pink Ears- hearing intact Oropharynx- clear Neck- supple, no JVP Lymph- no cervical lymphadenopathy Lungs- Clear to ausculation bilaterally, normal work of breathing Heart- Regular rate and rhythm, no murmurs, rubs or gallops, PMI not laterally displaced GI- soft, NT, ND, + BS Extremities- no clubbing, cyanosis, or edema MS- no significant deformity or atrophy Skin- no rash or lesion Psych- euthymic mood, full affect Neuro- strength and sensation are intact  ekg today reveals sinus rhythm 53 bpm, PR 176, otherwise normal ekg  Assessment and Plan:  1. Chest pain/ SOB Both typical and atypical features Will obtain stress myoview for CV risk stratification Sl NTG prescribed today He should go to the ER with any refractory symptoms I have encouraged follow-up with Dr Delton CoombesByrum as above Smoking cessation is encouraged, he is not ready to quit  2. Tobacco cessation discussed at length today  3. Syncope- vasovagal, without recurrence  Follow-up with Lawson FiscalLori in 4 weeks to discuss CP/ SOB follow-up/ results of stress test If low risk study, I will see as needed going forwad If high risk, would need to establish with a general cardiologist

## 2013-08-11 NOTE — Patient Instructions (Signed)
Your physician recommends that you continue on your current medications as directed. Please refer to the Current Medication list given to you today.  Your physician has requested that you have en exercise stress myoview. For further information please visit [email or web address]. Please follow instruction sheet, as given.  Your physician discussed the hazards of tobacco use. Tobacco use cessation is recommended and techniques and options to help you quit were discussed. Smoking Cessation Quitting smoking is important to your health and has many advantages. However, it is not always easy to quit since nicotine is a very addictive drug. Often times, people try 3 times or more before being able to quit. This document explains the best ways for you to prepare to quit smoking. Quitting takes hard work and a lot of effort, but you can do it. ADVANTAGES OF QUITTING SMOKING  You will live longer, feel better, and live better.  Your body will feel the impact of quitting smoking almost immediately.  Within 20 minutes, blood pressure decreases. Your pulse returns to its normal level.  After 8 hours, carbon monoxide levels in the blood return to normal. Your oxygen level increases.  After 24 hours, the chance of having a heart attack starts to decrease. Your breath, hair, and body stop smelling like smoke.  After 48 hours, damaged nerve endings begin to recover. Your sense of taste and smell improve.  After 72 hours, the body is virtually free of nicotine. Your bronchial tubes relax and breathing becomes easier.  After 2 to 12 weeks, lungs can hold more air. Exercise becomes easier and circulation improves.  The risk of having a heart attack, stroke, cancer, or lung disease is greatly reduced.  After 1 year, the risk of coronary heart disease is cut in half.  After 5 years, the risk of stroke falls to the same as a nonsmoker.  After 10 years, the risk of lung cancer is cut in half and the risk of  other cancers decreases significantly.  After 15 years, the risk of coronary heart disease drops, usually to the level of a nonsmoker.  If you are pregnant, quitting smoking will improve your chances of having a healthy baby.  The people you live with, especially any children, will be healthier.  You will have extra money to spend on things other than cigarettes. QUESTIONS TO THINK ABOUT BEFORE ATTEMPTING TO QUIT You may want to talk about your answers with your caregiver.  Why do you want to quit?  If you tried to quit in the past, what helped and what did not?  What will be the most difficult situations for you after you quit? How will you plan to handle them?  Who can help you through the tough times? Your family? Friends? A caregiver?  What pleasures do you get from smoking? What ways can you still get pleasure if you quit? Here are some questions to ask your caregiver:  How can you help me to be successful at quitting?  What medicine do you think would be best for me and how should I take it?  What should I do if I need more help?  What is smoking withdrawal like? How can I get information on withdrawal? GET READY  Set a quit date.  Change your environment by getting rid of all cigarettes, ashtrays, matches, and lighters in your home, car, or work. Do not let people smoke in your home.  Review your past attempts to quit. Think about what worked and what did not.  GET SUPPORT AND ENCOURAGEMENT You have a better chance of being successful if you have help. You can get support in many ways.  Tell your family, friends, and co-workers that you are going to quit and need their support. Ask them not to smoke around you.  Get individual, group, or telephone counseling and support. Programs are available at [facility]. Call your local health department for information about programs in your area.  Spiritual beliefs and practices may help some smokers  quit.  Download a "quit meter" on your computer to keep track of quit statistics, such as how long you have gone without smoking, cigarettes not smoked, and money saved.  Get a self-help book about quitting smoking and staying off of tobacco. LEARN NEW SKILLS AND BEHAVIORS  Distract yourself from urges to smoke. Talk to someone, go for a walk, or occupy your time with a task.  Change your normal routine. Take a different route to work. Drink tea instead of coffee. Eat breakfast in a different place.  Reduce your stress. Take a hot bath, exercise, or read a book.  Plan something enjoyable to do every day. Reward yourself for not smoking.  Explore interactive web-based programs that specialize in helping you quit. GET MEDICINE AND USE IT CORRECTLY Medicines can help you stop smoking and decrease the urge to smoke. Combining medicine with the above behavioral methods and support can greatly increase your chances of successfully quitting smoking.  Nicotine replacement therapy helps deliver nicotine to your body without the negative effects and risks of smoking. Nicotine replacement therapy includes nicotine gum, lozenges, inhalers, nasal sprays, and skin patches. Some may be available over-the-counter and others require a prescription.  Antidepressant medicine helps people abstain from smoking, but how this works is unknown. This medicine is available by prescription.  Nicotinic receptor partial agonist medicine simulates the effect of nicotine in your brain. This medicine is available by prescription. Ask your caregiver for advice about which medicines to use and how to use them based on your health history. Your caregiver will tell you what side effects to look out for if you choose to be on a medicine or therapy. Carefully read the information on the package. Do not use any other product containing nicotine while using a nicotine replacement product.  RELAPSE OR DIFFICULT SITUATIONS Most  relapses occur within the first 3 months after quitting. Do not be discouraged if you start smoking again. Remember, most people try several times before finally quitting. You may have symptoms of withdrawal because your body is used to nicotine. You may crave cigarettes, be irritable, feel very hungry, cough often, get headaches, or have difficulty concentrating. The withdrawal symptoms are only temporary. They are strongest when you first quit, but they will go away within 10-14 days. To reduce the chances of relapse, try to:  Avoid drinking alcohol. Drinking lowers your chances of successfully quitting.  Reduce the amount of caffeine you consume. Once you quit smoking, the amount of caffeine in your body increases and can give you symptoms, such as a rapid heartbeat, sweating, and anxiety.  Avoid smokers because they can make you want to smoke.  Do not let weight gain distract you. Many smokers will gain weight when they quit, usually less than 10 pounds. Eat a healthy diet and stay active. You can always lose the weight gained after you quit.  Find ways to improve your mood other than smoking. FOR MORE INFORMATION  www.smokefree.gov  Document Released: 01/15/2001  Document Revised: 07/23/2011 Document Reviewed: 05/02/2011 [facility] Patient Information 2015 [place], [place]. This information is not intended to replace advice given to you by your health care provider. Make sure you discuss any questions you have with your health care provider.   Your physician recommends that you schedule a follow-up appointment in: 4 weeks with Norma Fredrickson, CRNP

## 2013-08-23 ENCOUNTER — Ambulatory Visit (HOSPITAL_COMMUNITY): Payer: Medicare HMO | Attending: Cardiovascular Disease | Admitting: Radiology

## 2013-08-23 VITALS — BP 134/89 | HR 43 | Ht 68.0 in | Wt 160.0 lb

## 2013-08-23 DIAGNOSIS — R42 Dizziness and giddiness: Secondary | ICD-10-CM | POA: Insufficient documentation

## 2013-08-23 DIAGNOSIS — F17200 Nicotine dependence, unspecified, uncomplicated: Secondary | ICD-10-CM | POA: Insufficient documentation

## 2013-08-23 DIAGNOSIS — R0602 Shortness of breath: Secondary | ICD-10-CM

## 2013-08-23 DIAGNOSIS — R55 Syncope and collapse: Secondary | ICD-10-CM | POA: Insufficient documentation

## 2013-08-23 DIAGNOSIS — R079 Chest pain, unspecified: Secondary | ICD-10-CM | POA: Insufficient documentation

## 2013-08-23 DIAGNOSIS — I10 Essential (primary) hypertension: Secondary | ICD-10-CM | POA: Insufficient documentation

## 2013-08-23 DIAGNOSIS — Z8249 Family history of ischemic heart disease and other diseases of the circulatory system: Secondary | ICD-10-CM | POA: Insufficient documentation

## 2013-08-23 MED ORDER — TECHNETIUM TC 99M SESTAMIBI GENERIC - CARDIOLITE
10.0000 | Freq: Once | INTRAVENOUS | Status: AC | PRN
  Administered 2013-08-23: 10 via INTRAVENOUS

## 2013-08-23 MED ORDER — REGADENOSON 0.4 MG/5ML IV SOLN
0.4000 mg | Freq: Once | INTRAVENOUS | Status: AC
  Administered 2013-08-23: 0.4 mg via INTRAVENOUS

## 2013-08-23 MED ORDER — TECHNETIUM TC 99M SESTAMIBI GENERIC - CARDIOLITE
30.0000 | Freq: Once | INTRAVENOUS | Status: AC | PRN
  Administered 2013-08-23: 30 via INTRAVENOUS

## 2013-08-23 NOTE — Progress Notes (Signed)
[facility] SITE [facility] NUCLEAR MED [address] [place], [place] [address] [phone]    Cardiology Nuclear Med Study  Frank Gill is a 58 y.o. male     MRN : [id number]     DOB: 1955-02-26  Procedure Date: 08/23/2013  Nuclear Med Background Indication for Stress Test:  Evaluation for Ischemia History: Cath, No prior known history of CAD, 03-2013 Echo: EF=50-55% Cardiac Risk Factors: Family History - CAD, Hypertension, Lipids and Smoker  Symptoms: Chest Pain with/without exertion (last occurrence 3 months ago), Dizziness, DOE, SOB and Syncope   Nuclear Pre-Procedure Caffeine/Decaff Intake:  7:00pm NPO After: 10:30pm   Lungs:  clear O2 Sat: 97% on room air. IV 0.9% NS with Angio Cath:  20g  IV Site: R Hand  IV Started by:  Cathlyn Parsonsynthia Hasspacher, RN  Chest Size (in):  40 Cup Size: n/a  Height: 5\' 8"  (1.727 m)  Weight:  160 lb (72.576 kg)  BMI:  Body mass index is 24.33 kg/(m^2). Tech Comments:  n/a    Nuclear Med Study 1 or 2 day study: 1 day  Stress Test Type:  Treadmill/Lexiscan  Reading MD: n/a  Order Authorizing Provider:  Jarold SongJames Allred,MD  Resting Radionuclide: Technetium 4449m Sestamibi  Resting Radionuclide Dose: 11.0 mCi   Stress Radionuclide:  Technetium 4149m Sestamibi  Stress Radionuclide Dose: 33.0 mCi           Stress Protocol Rest HR: 43 Stress HR: 107  Rest BP: 134/89 Stress BP: 170/88  Exercise Time (min): 10:30 METS: n/a   Predicted Max HR: 161 bpm % Max HR: 66.46 bpm Rate Pressure Product: [id number]   Dose of Adenosine (mg):  n/a Dose of Lexiscan: 0.4 mg  Dose of Atropine (mg): n/a Dose of Dobutamine: n/a mcg/kg/min (at max HR)  Stress Test Technologist: Irean HongPatsy Edwards, RN  Nuclear Technologist:  Harlow AsaElizabeth Young, CNMT     Rest Procedure:  Myocardial perfusion imaging was performed at rest 45 minutes following the intravenous administration of Technetium 749m Sestamibi. Rest ECG: sinsu bradycardia  Stress Procedure: The patient attempted to walk the  treadmill utilizing the Bruce Protocol for 8:00 minutes , but was unable to reach target heart rate due to DOE, RPE=15. The patient received IV Lexiscan 0.4 mg over 15-seconds with concurrent low level exercise and then Technetium 1849m Sestamibi was injected at 30-seconds while the patient continued walking one more minute. The patient complained of DOE with Lexiscan. Quantitative spect images were obtained after a 45-minute delay. Stress ECG: There are scattered PACs.  QPS Raw Data Images:  There is interference from nuclear activity from structures below the diaphragm. This does not affect the ability to read the study.  There is diaphragmatic attenuation. Stress Images:  There is a very small area of decreased uptake in the inferoapex. Rest Images:  There decreased uptake in the inferoapex. Subtraction (SDS):  No evidence of ischemia. Transient Ischemic Dilatation (Normal <1.22):  1.04 Lung/Heart Ratio (Normal <0.45):  0.29  Quantitative Gated Spect Images QGS EDV:  151 ml QGS ESV:  76 ml  Impression Exercise Capacity:  Lexiscan with low level exercise. BP Response:  Normal blood pressure response. Clinical Symptoms:  There is dyspnea. ECG Impression:  There are scattered PACs. Comparison with Prior Nuclear Study: No previous nuclear study performed  Overall Impression:  Low risk stress nuclear study with small fixed inferoapical defect consistent with diaphragmatic attenuation.  No ischemia..  LV Ejection Fraction: 49%.  LV Wall Motion:  NL LV Function; NL  Wall Motion.  EF calculated at 49% but visually appears to be 55%  Signed: Armanda Magic, MD [facility]

## 2013-08-26 ENCOUNTER — Other Ambulatory Visit: Payer: Self-pay | Admitting: Family Medicine

## 2013-08-26 ENCOUNTER — Telehealth: Payer: Self-pay | Admitting: Family Medicine

## 2013-08-26 NOTE — Telephone Encounter (Signed)
appt scheduled for aug 10 with mmm

## 2013-08-27 ENCOUNTER — Telehealth: Payer: Self-pay | Admitting: *Deleted

## 2013-08-27 NOTE — Telephone Encounter (Signed)
pt notified rx for Tramadol ready for pick up RX to the front

## 2013-08-27 NOTE — Telephone Encounter (Signed)
Patient last seen in office on 05-06-13. Rx last filled on 03-12-13 for #[phone]. Please advise. If approved please print and route to Pool A so nurse can call patient to pick up

## 2013-09-07 ENCOUNTER — Ambulatory Visit: Payer: Medicare HMO | Admitting: Nurse Practitioner

## 2013-09-13 ENCOUNTER — Ambulatory Visit: Payer: Medicare HMO | Admitting: Family Medicine

## 2013-12-25 ENCOUNTER — Other Ambulatory Visit: Payer: Self-pay | Admitting: Family Medicine

## 2014-01-10 ENCOUNTER — Other Ambulatory Visit: Payer: Self-pay | Admitting: Family Medicine

## 2014-01-10 NOTE — Telephone Encounter (Signed)
Please review and advise.

## 2014-01-10 NOTE — Telephone Encounter (Signed)
Last seen 05/06/13 B Oxford  Last lipid 03/09/13  If approved print and route to nurse

## 2014-01-11 ENCOUNTER — Telehealth: Payer: Self-pay | Admitting: Family Medicine

## 2014-01-11 NOTE — Telephone Encounter (Signed)
Needs Tramadol and Lipitor refilled, last lipids 03/2013

## 2014-01-12 NOTE — Telephone Encounter (Signed)
Pt needs to be seen appt scheduled

## 2014-01-13 ENCOUNTER — Encounter (HOSPITAL_COMMUNITY): Payer: Self-pay | Admitting: Cardiology

## 2014-01-19 ENCOUNTER — Ambulatory Visit (INDEPENDENT_AMBULATORY_CARE_PROVIDER_SITE_OTHER): Payer: Medicare HMO | Admitting: Family Medicine

## 2014-01-19 ENCOUNTER — Encounter: Payer: Self-pay | Admitting: Family Medicine

## 2014-01-19 VITALS — BP 148/94 | HR 60 | Temp 97.6°F | Ht 68.0 in | Wt 160.0 lb

## 2014-01-19 DIAGNOSIS — I10 Essential (primary) hypertension: Secondary | ICD-10-CM

## 2014-01-19 DIAGNOSIS — G894 Chronic pain syndrome: Secondary | ICD-10-CM

## 2014-01-19 DIAGNOSIS — M549 Dorsalgia, unspecified: Secondary | ICD-10-CM

## 2014-01-19 DIAGNOSIS — F329 Major depressive disorder, single episode, unspecified: Secondary | ICD-10-CM

## 2014-01-19 DIAGNOSIS — K640 First degree hemorrhoids: Secondary | ICD-10-CM

## 2014-01-19 DIAGNOSIS — J439 Emphysema, unspecified: Secondary | ICD-10-CM

## 2014-01-19 DIAGNOSIS — J302 Other seasonal allergic rhinitis: Secondary | ICD-10-CM

## 2014-01-19 DIAGNOSIS — F32A Depression, unspecified: Secondary | ICD-10-CM

## 2014-01-19 MED ORDER — ATORVASTATIN CALCIUM 40 MG PO TABS: 40.0000 mg | ORAL_TABLET | Freq: Every day | ORAL | Status: DC

## 2014-01-19 MED ORDER — METOPROLOL SUCCINATE ER 50 MG PO TB24: 50.0000 mg | ORAL_TABLET | Freq: Every day | ORAL | Status: DC

## 2014-01-19 MED ORDER — ALBUTEROL SULFATE HFA 108 (90 BASE) MCG/ACT IN AERS: 1.0000 | INHALATION_SPRAY | Freq: Four times a day (QID) | RESPIRATORY_TRACT | Status: DC | PRN

## 2014-01-19 MED ORDER — TRAMADOL HCL 50 MG PO TABS: 50.0000 mg | ORAL_TABLET | Freq: Two times a day (BID) | ORAL | Status: DC

## 2014-01-19 MED ORDER — FENOFIBRATE 150 MG PO CAPS: 150.0000 mg | ORAL_CAPSULE | Freq: Every day | ORAL | Status: DC

## 2014-01-19 MED ORDER — LORATADINE 10 MG PO TABS: 10.0000 mg | ORAL_TABLET | Freq: Every day | ORAL | Status: DC

## 2014-01-19 MED ORDER — CITALOPRAM HYDROBROMIDE 20 MG PO TABS: 20.0000 mg | ORAL_TABLET | Freq: Every day | ORAL | Status: DC

## 2014-01-19 MED ORDER — GABAPENTIN 600 MG PO TABS: 600.0000 mg | ORAL_TABLET | Freq: Three times a day (TID) | ORAL | Status: DC

## 2014-01-19 MED ORDER — HYDROCORTISONE 2.5 % RE CREA: 1.0000 "application " | TOPICAL_CREAM | Freq: Two times a day (BID) | RECTAL | Status: DC

## 2014-01-19 NOTE — Progress Notes (Signed)
   Subjective:    Patient ID: Frank Gill, male    DOB: 1955/09/23, 58 y.o.   MRN: [id number]  HPI Patient is here for refills on medicine.  He has chronic pain and takes tramadol and needs refills.  Review of Systems  Constitutional: Negative for fever.  HENT: Negative for ear pain.   Eyes: Negative for discharge.  Respiratory: Negative for cough.   Cardiovascular: Negative for chest pain.  Gastrointestinal: Negative for abdominal distention.  Endocrine: Negative for polyuria.  Genitourinary: Negative for difficulty urinating.  Musculoskeletal: Negative for gait problem and neck pain.  Skin: Negative for color change and rash.  Neurological: Negative for speech difficulty and headaches.  Psychiatric/Behavioral: Negative for agitation.       Objective:    BP 148/94 mmHg  Pulse 60  Temp(Src) 97.6 F (36.4 C) (Oral)  Ht 5\' 8"  (1.727 m)  Wt 160 lb (72.576 kg)  BMI 24.33 kg/m2    Physical Exam  Constitutional: He is oriented to person, place, and time. He appears well-developed and well-nourished.  HENT:  Head: Normocephalic and atraumatic.  Mouth/Throat: Oropharynx is clear and moist.  Eyes: Pupils are equal, round, and reactive to light.  Neck: Normal range of motion. Neck supple.  Cardiovascular: Normal rate and regular rhythm.   No murmur heard. Pulmonary/Chest: Effort normal and breath sounds normal.  Abdominal: Soft. Bowel sounds are normal. There is no tenderness.  Neurological: He is alert and oriented to person, place, and time.  Skin: Skin is warm and dry.  Psychiatric: He has a normal mood and affect.          Assessment & Plan:     ICD-9-CM ICD-10-CM   1. Back pain with radiation 724.5 M54.9 gabapentin (NEURONTIN) 600 MG tablet  2. Chronic pain syndrome 338.4 G89.4 traMADol (ULTRAM) 50 MG tablet     Fenofibrate 150 MG CAPS  3. Seasonal allergies 477.9 J30.2 loratadine (CLARITIN) 10 MG tablet  4. First degree hemorrhoids 455.6 K64.0 hydrocortisone  (PROCTOSOL HC) 2.5 % rectal cream  5. Depressed 311 F32.9 citalopram (CELEXA) 20 MG tablet  6. Depression 311 F32.9 atorvastatin (LIPITOR) 40 MG tablet  7. Pulmonary emphysema, unspecified emphysema type 492.8 J43.9 albuterol (PROVENTIL HFA;VENTOLIN HFA) 108 (90 BASE) MCG/ACT inhaler  8. Essential hypertension 401.9 I10 metoprolol succinate (TOPROL-XL) 50 MG 24 hr tablet     Return in about 3 months (around 04/20/2014).  Deatra CanterWilliam J Oxford FNP

## 2014-04-06 ENCOUNTER — Emergency Department (HOSPITAL_COMMUNITY)
Admission: EM | Admit: 2014-04-06 | Discharge: 2014-04-06 | Disposition: A | Discharge status: Home / Self Care | Payer: Medicare HMO | Attending: Emergency Medicine | Admitting: Emergency Medicine

## 2014-04-06 ENCOUNTER — Emergency Department (HOSPITAL_COMMUNITY): Payer: Medicare HMO

## 2014-04-06 ENCOUNTER — Encounter (HOSPITAL_COMMUNITY): Payer: Self-pay | Admitting: Emergency Medicine

## 2014-04-06 DIAGNOSIS — Y9389 Activity, other specified: Secondary | ICD-10-CM | POA: Diagnosis not present

## 2014-04-06 DIAGNOSIS — Z8739 Personal history of other diseases of the musculoskeletal system and connective tissue: Secondary | ICD-10-CM | POA: Insufficient documentation

## 2014-04-06 DIAGNOSIS — F4310 Post-traumatic stress disorder, unspecified: Secondary | ICD-10-CM | POA: Diagnosis not present

## 2014-04-06 DIAGNOSIS — Z872 Personal history of diseases of the skin and subcutaneous tissue: Secondary | ICD-10-CM | POA: Insufficient documentation

## 2014-04-06 DIAGNOSIS — Z7982 Long term (current) use of aspirin: Secondary | ICD-10-CM | POA: Insufficient documentation

## 2014-04-06 DIAGNOSIS — Y92410 Unspecified street and highway as the place of occurrence of the external cause: Secondary | ICD-10-CM | POA: Diagnosis not present

## 2014-04-06 DIAGNOSIS — E785 Hyperlipidemia, unspecified: Secondary | ICD-10-CM | POA: Diagnosis not present

## 2014-04-06 DIAGNOSIS — Y998 Other external cause status: Secondary | ICD-10-CM | POA: Diagnosis not present

## 2014-04-06 DIAGNOSIS — S99922A Unspecified injury of left foot, initial encounter: Secondary | ICD-10-CM | POA: Insufficient documentation

## 2014-04-06 DIAGNOSIS — M1990 Unspecified osteoarthritis, unspecified site: Secondary | ICD-10-CM | POA: Insufficient documentation

## 2014-04-06 DIAGNOSIS — Z8719 Personal history of other diseases of the digestive system: Secondary | ICD-10-CM | POA: Diagnosis not present

## 2014-04-06 DIAGNOSIS — Z79899 Other long term (current) drug therapy: Secondary | ICD-10-CM | POA: Diagnosis not present

## 2014-04-06 DIAGNOSIS — Z862 Personal history of diseases of the blood and blood-forming organs and certain disorders involving the immune mechanism: Secondary | ICD-10-CM | POA: Insufficient documentation

## 2014-04-06 DIAGNOSIS — Z7952 Long term (current) use of systemic steroids: Secondary | ICD-10-CM | POA: Diagnosis not present

## 2014-04-06 DIAGNOSIS — Z720 Tobacco use: Secondary | ICD-10-CM | POA: Insufficient documentation

## 2014-04-06 DIAGNOSIS — M79672 Pain in left foot: Secondary | ICD-10-CM
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
## 2014-04-06 NOTE — ED Notes (Signed)
Found patient walking around in the hall.  Pt asked to come to the waiting area to be discharged.

## 2014-04-06 NOTE — Discharge Instructions (Signed)
Xray normal.  Ice, elevate, tylenol/ibuprofen
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
## 2014-09-08 ENCOUNTER — Encounter (INDEPENDENT_AMBULATORY_CARE_PROVIDER_SITE_OTHER): Payer: Self-pay

## 2014-09-08 ENCOUNTER — Ambulatory Visit (INDEPENDENT_AMBULATORY_CARE_PROVIDER_SITE_OTHER): Payer: Medicare HMO | Admitting: Family Medicine

## 2014-09-08 ENCOUNTER — Encounter: Payer: Self-pay | Admitting: Family Medicine

## 2014-09-08 VITALS — BP 133/86 | HR 54 | Temp 97.0°F | Ht 65.0 in | Wt 157.6 lb

## 2014-09-08 DIAGNOSIS — Z0000 Encounter for general adult medical examination without abnormal findings: Secondary | ICD-10-CM | POA: Diagnosis not present

## 2014-09-08 DIAGNOSIS — M549 Dorsalgia, unspecified: Secondary | ICD-10-CM

## 2014-09-08 DIAGNOSIS — H66001 Acute suppurative otitis media without spontaneous rupture of ear drum, right ear: Secondary | ICD-10-CM

## 2014-09-08 DIAGNOSIS — G8929 Other chronic pain: Secondary | ICD-10-CM

## 2014-09-08 DIAGNOSIS — E781 Pure hyperglyceridemia: Secondary | ICD-10-CM

## 2014-09-08 DIAGNOSIS — I10 Essential (primary) hypertension: Secondary | ICD-10-CM

## 2014-09-08 DIAGNOSIS — H66009 Acute suppurative otitis media without spontaneous rupture of ear drum, unspecified ear: Secondary | ICD-10-CM | POA: Insufficient documentation

## 2014-09-08 DIAGNOSIS — G894 Chronic pain syndrome: Secondary | ICD-10-CM

## 2014-09-08 DIAGNOSIS — E785 Hyperlipidemia, unspecified: Secondary | ICD-10-CM

## 2014-09-08 DIAGNOSIS — Z720 Tobacco use: Secondary | ICD-10-CM

## 2014-09-08 DIAGNOSIS — R001 Bradycardia, unspecified: Secondary | ICD-10-CM

## 2014-09-08 DIAGNOSIS — F32A Depression, unspecified: Secondary | ICD-10-CM

## 2014-09-08 DIAGNOSIS — F329 Major depressive disorder, single episode, unspecified: Secondary | ICD-10-CM

## 2014-09-08 MED ORDER — CEFDINIR 300 MG PO CAPS: 300.0000 mg | ORAL_CAPSULE | Freq: Two times a day (BID) | ORAL | Status: DC

## 2014-09-08 MED ORDER — FENOFIBRATE 150 MG PO CAPS: 150.0000 mg | ORAL_CAPSULE | Freq: Every day | ORAL | Status: DC

## 2014-09-08 MED ORDER — ATORVASTATIN CALCIUM 40 MG PO TABS: 40.0000 mg | ORAL_TABLET | Freq: Every day | ORAL | Status: DC

## 2014-09-08 MED ORDER — CITALOPRAM HYDROBROMIDE 20 MG PO TABS: 20.0000 mg | ORAL_TABLET | Freq: Every day | ORAL | Status: DC

## 2014-09-08 MED ORDER — PREGABALIN 75 MG PO CAPS: 75.0000 mg | ORAL_CAPSULE | Freq: Two times a day (BID) | ORAL | Status: DC

## 2014-09-08 NOTE — Assessment & Plan Note (Signed)
fasting labs Continue fenofibrate

## 2014-09-08 NOTE — Assessment & Plan Note (Signed)
Right TM with purulence present Patient complains of some ear drainage Also some hearing loss, will try antibody Also discussed that he will need ENT if he does not improve with antibiotics given his hearing loss

## 2014-09-08 NOTE — Assessment & Plan Note (Signed)
Continue statin  Labs

## 2014-09-08 NOTE — Assessment & Plan Note (Signed)
Controlled on beta blocker, continue

## 2014-09-08 NOTE — Patient Instructions (Signed)
Great to meet you guys!  Come back for fasting labs  You will be scheduled for an ultrasound for your aortic artery  Consider stopping smoking 1800- Quit now You may also meet with our clinical pharmacist  Come back in 3 months

## 2014-09-08 NOTE — Assessment & Plan Note (Signed)
Pulse 56 today, monitor closely with beta blocker Consider decreased beta blocker

## 2014-09-08 NOTE — Assessment & Plan Note (Signed)
Counseled about tobacco cessation Offered 100 quit now, also offered conversation with her clinical pharmacist With significant smoking history he is at elevated risk for AAA, so I ordered an abdominal ultrasound today

## 2014-09-08 NOTE — Progress Notes (Signed)
Patient ID: Frank Gill, male   DOB: 07-02-1955, 59 y.o.   MRN: [id number]   HPI  Patient presents today for for annual physical exam as well as  follow-up chronic medical problems  Hypertension No chest pain, dyspnea, palpitations, leg edema Taking meds daily, his wife felt him with compliance  Exercises by walking approximately 3-4 miles per day, he likes to watch wildlife as he walks.  Tobacco abuse Long-time smoker He is thinking about quitting and has tried several times  Lipids Does not watch diet Taking statin, and fenofibrate  Low back pain Explains his mid low back pain worse with working around the house. He can walk without pain. He has some associated left calf soreness whenever his back starts to hurt. Tramadol does not help much Gabapentin-she's been taking this for about a year and a half and he can tell that's helping. No bowel or bladder dysfunction, saddle anesthesia, or leg weakness.  Ear drainage Has noticed ear drainage for several months when he lies down at night Wife complains of some hearing loss   PMH: Smoking status noted ROS: Per HPI, otherwise negative  Objective: BP 133/86 mmHg  Pulse 54  Temp(Src) 97 F (36.1 C) (Oral)  Ht '5\' 5"'  (1.651 m)  Wt 157 lb 9.6 oz (71.487 kg)  BMI 26.23 kg/m2 Gen: NAD, alert, cooperative with exam HEENT: NCAT, EOMI, PERRL, right TM with yellow purulence across the superior part of the TM CV: RRR, good S1/S2, no murmur, 2+ popliteal pulses, no carotid bruit Resp: CTABL, no wheezes, non-labored Abd: SNTND, BS present, no guarding or organomegaly Ext: No edema, warm, hair loss on bilateral lateral lower legs Neuro: Alert and oriented, No gross deficits Skin no rash Psych: Normal mood and affect  Assessment and plan:  Tobacco abuse Counseled about tobacco cessation Offered 100 quit now, also offered conversation with her clinical pharmacist With significant smoking history he is at elevated risk for AAA, so  I ordered an abdominal ultrasound today  Hypertriglyceridemia  fasting labs Continue fenofibrate  HTN (hypertension) Controlled on beta blocker, continue  Dyslipidemia Continue statin Labs  Bradycardia Pulse 56 today, monitor closely with beta blocker Consider decreased beta blocker  Chronic back pain Explains he has 26 years of unchanged midline back pain No red flags Gabapentin unclear if it's helping, tramadol not helping Change gabapentin to Lyrica  Acute suppurative otitis media Right TM with purulence present Patient complains of some ear drainage Also some hearing loss, will try antibody Also discussed that he will need ENT if he does not improve with antibiotics given his hearing loss    Orders Placed This Encounter  Procedures  . US Aorta    Standing Status: Future     Number of Occurrences:      Standing Expiration Date: 11/08/2015    Order Specific Question:  Reason for Exam (SYMPTOM  OR DIAGNOSIS REQUIRED)    Answer:  screening for AAA    Order Specific Question:  Preferred imaging location?    Answer:  [facility]  . CMP14+EGFR    Standing Status: Future     Number of Occurrences:      Standing Expiration Date: 09/08/2015  . CBC with Differential    Standing Status: Future     Number of Occurrences:      Standing Expiration Date: 09/08/2015  . Lipid Panel    Standing Status: Future     Number of Occurrences:      Standing Expiration Date: 09/08/2015  Meds ordered this encounter  Medications  . pregabalin (LYRICA) 75 MG capsule    Sig: Take 1 capsule (75 mg total) by mouth 2 (two) times daily.    Dispense:  180 capsule    Refill:  2  . Fenofibrate 150 MG CAPS    Sig: Take 1 capsule (150 mg total) by mouth at bedtime.    Dispense:  90 each    Refill:  3  . citalopram (CELEXA) 20 MG tablet    Sig: Take 1 tablet (20 mg total) by mouth daily.    Dispense:  90 tablet    Refill:  3  . atorvastatin (LIPITOR) 40 MG tablet    Sig: Take 1  tablet (40 mg total) by mouth daily at 6 PM.    Dispense:  90 tablet    Refill:  3

## 2014-09-08 NOTE — Assessment & Plan Note (Signed)
Explains he has 26 years of unchanged midline back pain No red flags Gabapentin unclear if it's helping, tramadol not helping Change gabapentin to Lyrica

## 2014-09-09 ENCOUNTER — Other Ambulatory Visit: Payer: Medicare HMO

## 2014-09-12 ENCOUNTER — Other Ambulatory Visit: Payer: Medicare HMO

## 2014-09-13 ENCOUNTER — Other Ambulatory Visit: Payer: Medicare HMO

## 2014-09-14 ENCOUNTER — Telehealth: Payer: Self-pay

## 2014-09-14 NOTE — Telephone Encounter (Signed)
Lyrica approved for prior authorization with insurance end date 02/04/15

## 2014-09-19 ENCOUNTER — Other Ambulatory Visit (INDEPENDENT_AMBULATORY_CARE_PROVIDER_SITE_OTHER): Payer: Medicare HMO

## 2014-09-19 ENCOUNTER — Other Ambulatory Visit: Payer: Medicare HMO

## 2014-09-19 DIAGNOSIS — I10 Essential (primary) hypertension: Secondary | ICD-10-CM | POA: Diagnosis not present

## 2014-09-19 NOTE — Progress Notes (Signed)
Lab only 

## 2014-09-20 LAB — CBC WITH DIFFERENTIAL/PLATELET
Basophils Absolute: 0 10*3/uL (ref 0.0–0.2)
Basos: 1 %
EOS (ABSOLUTE): 0.4 10*3/uL (ref 0.0–0.4)
EOS: 6 %
HEMOGLOBIN: 16.8 g/dL (ref 12.6–17.7)
Hematocrit: 50.4 % (ref 37.5–51.0)
Immature Grans (Abs): 0 10*3/uL (ref 0.0–0.1)
Immature Granulocytes: 0 %
LYMPHS: 25 %
Lymphocytes Absolute: 1.8 10*3/uL (ref 0.7–3.1)
MCH: 31.7 pg (ref 26.6–33.0)
MCHC: 33.3 g/dL (ref 31.5–35.7)
MCV: 95 fL (ref 79–97)
MONOCYTES: 8 %
Monocytes Absolute: 0.6 10*3/uL (ref 0.1–0.9)
Neutrophils Absolute: 4.2 10*3/uL (ref 1.4–7.0)
Neutrophils: 60 %
Platelets: 259 10*3/uL (ref 150–379)
RBC: 5.3 x10E6/uL (ref 4.14–5.80)
RDW: 13.4 % (ref 12.3–15.4)
WBC: 7.1 10*3/uL (ref 3.4–10.8)

## 2014-09-20 LAB — CMP14+EGFR
ALBUMIN: 4.2 g/dL (ref 3.6–4.8)
ALK PHOS: 73 IU/L (ref 39–117)
ALT: 13 IU/L (ref 0–44)
AST: 19 IU/L (ref 0–40)
Albumin/Globulin Ratio: 2 (ref 1.1–2.5)
BILIRUBIN TOTAL: 0.4 mg/dL (ref 0.0–1.2)
BUN / CREAT RATIO: 10 (ref 10–22)
BUN: 9 mg/dL (ref 8–27)
CO2: 29 mmol/L (ref 18–29)
CREATININE: 0.88 mg/dL (ref 0.76–1.27)
Calcium: 9 mg/dL (ref 8.6–10.2)
Chloride: 100 mmol/L (ref 97–108)
GFR calc Af Amer: 108 mL/min/{1.73_m2} (ref >59)
GFR calc non Af Amer: 93 mL/min/{1.73_m2} (ref >59)
GLUCOSE: 88 mg/dL (ref 65–99)
Globulin, Total: 2.1 g/dL (ref 1.5–4.5)
Potassium: 3.9 mmol/L (ref 3.5–5.2)
Sodium: 143 mmol/L (ref 134–144)
Total Protein: 6.3 g/dL (ref 6.0–8.5)

## 2014-09-20 LAB — LIPID PANEL
CHOLESTEROL TOTAL: 155 mg/dL (ref 100–199)
Chol/HDL Ratio: 5.3 ratio units — ABNORMAL HIGH (ref 0.0–5.0)
HDL: 29 mg/dL — AB (ref >39)
LDL Calculated: 103 mg/dL — ABNORMAL HIGH (ref 0–99)
TRIGLYCERIDES: 114 mg/dL (ref 0–149)
VLDL CHOLESTEROL CAL: 23 mg/dL (ref 5–40)

## 2014-09-22 ENCOUNTER — Ambulatory Visit
Admission: RE | Admit: 2014-09-22 | Discharge: 2014-09-22 | Disposition: A | Discharge status: Home / Self Care | Payer: Medicare HMO | Source: Ambulatory Visit | Attending: Family Medicine | Admitting: Family Medicine

## 2014-09-22 DIAGNOSIS — Z720 Tobacco use: Secondary | ICD-10-CM

## 2014-09-26 ENCOUNTER — Telehealth: Payer: Self-pay | Admitting: Family Medicine

## 2014-09-26 DIAGNOSIS — I723 Aneurysm of iliac artery: Secondary | ICD-10-CM | POA: Insufficient documentation

## 2014-09-26 NOTE — Telephone Encounter (Signed)
Called and discussed AAA which does not meet criteria for surgery, he does have iliac aneurysms that are concerning enough to send for vascular surgery consult for monitoring/treatment options.   Sent referral, pateint anticipating referral.   Murtis Sink, MD [place] [facility] 09/26/2014, 5:53 PM

## 2014-10-03 ENCOUNTER — Ambulatory Visit (INDEPENDENT_AMBULATORY_CARE_PROVIDER_SITE_OTHER): Payer: Medicare HMO | Admitting: Family Medicine

## 2014-10-03 ENCOUNTER — Encounter: Payer: Self-pay | Admitting: Family Medicine

## 2014-10-03 VITALS — BP 133/80 | HR 54 | Temp 97.1°F | Ht 65.0 in | Wt 158.0 lb

## 2014-10-03 DIAGNOSIS — Z720 Tobacco use: Secondary | ICD-10-CM

## 2014-10-03 DIAGNOSIS — H66001 Acute suppurative otitis media without spontaneous rupture of ear drum, right ear: Secondary | ICD-10-CM

## 2014-10-03 DIAGNOSIS — H6990 Unspecified Eustachian tube disorder, unspecified ear: Secondary | ICD-10-CM | POA: Insufficient documentation

## 2014-10-03 DIAGNOSIS — R430 Anosmia: Secondary | ICD-10-CM | POA: Diagnosis not present

## 2014-10-03 DIAGNOSIS — H6981 Other specified disorders of Eustachian tube, right ear: Secondary | ICD-10-CM | POA: Diagnosis not present

## 2014-10-03 DIAGNOSIS — H6980 Other specified disorders of Eustachian tube, unspecified ear: Secondary | ICD-10-CM | POA: Insufficient documentation

## 2014-10-03 MED ORDER — MOMETASONE FUROATE 50 MCG/ACT NA SUSP: 2.0000 | Freq: Every day | NASAL | Status: DC

## 2014-10-03 NOTE — Progress Notes (Signed)
   HPI  Patient presents today  to follow-up after ear infection and anosmia (loos of smell)  Ear infection Resolved after a robotic  He describes that he has crackling of the ear with swallowing and blowing his nose and sometimes feels it draining. He states that he had the infection because it did not drain properly He does also have some congestion issues at baseline.  Loss of smell States that he has had decreased smell for several years. He is a current smoker.  PMH: Smoking status noted ROS: Per HPI, otherwise negative  Objective: BP 133/80 mmHg  Pulse 54  Temp(Src) 97.1 F (36.2 C) (Oral)  Ht  (1.651 m)  Wt 158 lb (71.668 kg)  BMI 26.29 kg/m2 Gen: NAD, alert, cooperative with exam HEENT: NCAT, bilateral TMs WNL, noisy breathing through his nose throughout the exam CV: RRR, good S1/S2, no murmur Resp: CTABL, no wheezes, non-labored Ext: No edema, warm Neuro: Alert and oriented, No gross deficits  Assessment and plan:  #eustachian tube dysfunction, follow-up ear infection Infection resolved, possibly secondary to eustachian tube Start nasal steroid, Nasonex sent  #  anosmia Secondary to smoking, discussed and encouraged to quit   # tobacco abuse Current smoker, encouraged quitting   Meds ordered this encounter  Medications  . mometasone (NASONEX) 50 MCG/ACT nasal spray    Sig: Place 2 sprays into the nose daily.    Dispense:  17 g    Refill:  12    Murtis Sink, MD [place] [facility] 10/03/2014, 8:47 AM

## 2014-10-03 NOTE — Patient Instructions (Addendum)
Great to see you again  You have eustachian tube dysfunction, the nasal spray will help. You will see improvement in about 2 weeks of use.   Come back in 6 months to discuss your other medical problems

## 2014-10-19 ENCOUNTER — Encounter: Payer: Self-pay | Admitting: Vascular Surgery

## 2014-10-21 ENCOUNTER — Encounter: Payer: Self-pay | Admitting: Vascular Surgery

## 2014-10-21 ENCOUNTER — Ambulatory Visit (INDEPENDENT_AMBULATORY_CARE_PROVIDER_SITE_OTHER): Payer: Medicare HMO | Admitting: Vascular Surgery

## 2014-10-21 VITALS — BP 154/97 | HR 51 | Ht 65.0 in | Wt 161.0 lb

## 2014-10-21 DIAGNOSIS — I714 Abdominal aortic aneurysm, without rupture, unspecified: Secondary | ICD-10-CM | POA: Insufficient documentation

## 2014-10-21 DIAGNOSIS — I723 Aneurysm of iliac artery: Secondary | ICD-10-CM

## 2014-10-21 NOTE — Progress Notes (Signed)
Referred by: Elenora Gamma, MD [address] [place], [place] [address]  Reason for referral: AAA  History of Present Illness  The patient is a 59 y.o. (05-13-1955) male who presents with chief complaint: "aneurysm".  Previous studies demonstrate an AAA, measuring 3.4 cm.  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  The patient does not have back or abdominal pain.  The patient notes no history of embolic episodes from the AAA.  The patient's risk factors for AAA included: age, male sex, and active smoking.  Past Medical History  Diagnosis Date  . Arthritis   . Hyperlipidemia   . Shortness of breath   . Constipation   . Left sided sciatica   . Vaso vagal episode   . Near syncope   . Syncope   . Dyslipidemia   . Exertional shortness of breath   . Dyspnea   . PTSD (post-traumatic stress disorder)   . Major depression, recurrent   . Leukocytosis, unspecified   . Hyperglycemia   . Abscess of back   . Bradycardia     Past Surgical History  Procedure Laterality Date  . Cardiac catheterization    . Left heart catheterization with coronary angiogram N/A 02/09/2013    Procedure: LEFT HEART CATHETERIZATION WITH CORONARY ANGIOGRAM;  Surgeon: Marykay Lex, MD;  Location: [facility] CATH LAB;  Service: Cardiovascular;  Laterality: N/A;  . Cyst excision    . Vasectomy      Social History   Social History  . Marital Status: Divorced    Spouse Name: N/A  . Number of Children: 2  . Years of Education: N/A   Occupational History  . Not on file.   Social History Main Topics  . Smoking status: Current Every Day Smoker -- 1.00 packs/day for 30 years    Types: Cigars, Cigarettes    Start date: 05/21/1979  . Smokeless tobacco: Never Used     Comment: uses e-sig  . Alcohol Use: 1.2 oz/week    2 Cans of beer per week     Comment: occ  . Drug Use: No  . Sexual Activity: Not on file   Other Topics Concern  . Not on file   Social History Narrative     Family History  Problem Relation Age of Onset  . Heart disease Mother   . Hypertension Mother   . Hyperlipidemia Mother   . Heart attack Mother   . Mental illness Sister   . Leukemia Father   . Cancer Father   . Hyperlipidemia Father   . Hypertension Father     Current Outpatient Prescriptions  Medication Sig Dispense Refill  . aspirin EC 325 MG tablet Take 325 mg by mouth daily.    Marland Kitchen atorvastatin (LIPITOR) 40 MG tablet Take 1 tablet (40 mg total) by mouth daily at 6 PM. 90 tablet 3  . citalopram (CELEXA) 20 MG tablet Take 1 tablet (20 mg total) by mouth daily. 90 tablet 3  . Fenofibrate 150 MG CAPS Take 1 capsule (150 mg total) by mouth at bedtime. 90 each 3  . loratadine (CLARITIN) 10 MG tablet Take 1 tablet (10 mg total) by mouth daily. 90 tablet 3  . metoprolol succinate (TOPROL-XL) 50 MG 24 hr tablet Take 50 mg by mouth daily. Take with or immediately following a meal.    . pregabalin (LYRICA) 75 MG capsule Take 1 capsule (75 mg total) by  mouth 2 (two) times daily. 180 capsule 2  . traMADol (ULTRAM) 50 MG tablet Take 50 mg by mouth every 6 (six) hours as needed.    Marland Kitchen albuterol (PROVENTIL HFA;VENTOLIN HFA) 108 (90 BASE) MCG/ACT inhaler Inhale 1-2 puffs into the lungs every 6 (six) hours as needed for shortness of breath. (Patient not taking: Reported on 10/21/2014) 1 Inhaler 11  . cefdinir (OMNICEF) 300 MG capsule Take 1 capsule (300 mg total) by mouth 2 (two) times daily. For ear infection (Patient not taking: Reported on 10/21/2014) 20 capsule 0  . Cholecalciferol (VITAMIN D) 2000 UNITS CAPS Take 2,000 Units by mouth every evening.    . docusate sodium (COLACE) 100 MG capsule Take 200 mg by mouth as needed.    . hydrocortisone (PROCTOSOL HC) 2.5 % rectal cream Place 1 application rectally 2 (two) times daily. (Patient not taking: Reported on 10/21/2014) 30 g 11  . mometasone (NASONEX) 50 MCG/ACT nasal spray Place 2 sprays into the nose daily. (Patient not taking: Reported on  10/21/2014) 17 g 12  . Multiple Vitamin (MULTIVITAMIN WITH MINERALS) TABS tablet Take 1 tablet by mouth daily.     No current facility-administered medications for this visit.     Allergies  Allergen Reactions  . Poison Oak Extract [employer] Poison Oak]      REVIEW OF SYSTEMS:  (Positives checked otherwise negative)  CARDIOVASCULAR:   [ ]  chest pain,  [ ]  chest pressure,  [ ]  palpitations,  [ ]  shortness of breath when laying flat,  [ ]  shortness of breath with exertion,   [x]  pain in feet when walking,  [x]  pain in feet when laying flat, [ ]  history of blood clot in veins (DVT),  [ ]  history of phlebitis,  [ ]  swelling in legs,  [ ]  varicose veins  PULMONARY:   [ ]  productive cough,  [ ]  asthma,  [ ]  wheezing  NEUROLOGIC:   [ ]  weakness in arms or legs,  [ ]  numbness in arms or legs,  [ ]  difficulty speaking or slurred speech,  [ ]  temporary loss of vision in one eye,  [ ]  dizziness  HEMATOLOGIC:   [ ]  bleeding problems,  [ ]  problems with blood clotting too easily  MUSCULOSKEL:   [ ]  joint pain, [ ]  joint swelling  GASTROINTEST:   [ ]  vomiting blood,  [ ]  blood in stool     GENITOURINARY:   [ ]  burning with urination,  [ ]  blood in urine  PSYCHIATRIC:   [ ]  history of major depression  INTEGUMENTARY:   [ ]  rashes,  [ ]  ulcers  CONSTITUTIONAL:   [ ]  fever,  [ ]  chills   For VQI Use Only  PRE-ADM LIVING: Home  AMB STATUS: Ambulatory  CAD Sx: None  PRIOR CHF: None  STRESS TEST: [x]  No, [ ]  Normal, [ ]  + ischemia, [ ]  + MI, [ ]  Both   Physical Examination  Filed Vitals:   10/21/14 1500 10/21/14 1502  BP: 172/92 154/97  Pulse: 51   Height: 5\' 5"  (1.651 m)   Weight: 161 lb (73.029 kg)   SpO2: 97%    Body mass index is 26.79 kg/(m^2).  General: A&O x 3, WDWN  Head: [place]/AT  Ear/Nose/Throat: Hearing grossly intact, nares w/o erythema or drainage, oropharynx w/o Erythema/Exudate  Eyes: PERRLA, EOMI  Neck: Supple, no nuchal  rigidity, no palpable LAD  Pulmonary: Sym exp, good air movt, CTAB, no rales, rhonchi, & wheezing  Cardiac: RRR, Nl S1, S2, no Murmurs, rubs or gallops  Vascular: Vessel Right Left  Radial Palpable Palpable  Brachial Palpable Palpable  Carotid Palpable, without bruit Palpable, without bruit  Aorta Not palpable N/A  Femoral Palpable Palpable  Popliteal Prominently palpable Prominently palpable  PT Palpable Palpable  DP Palpable Palpable   Gastrointestinal: soft, NTND, no G/R, no HSM, no masses, no CVAT B  Musculoskeletal: M/S 5/5 throughout , Extremities without ischemic changes   Neurologic: CN 2-12 intact , Pain and light touch intact in extremities , Motor exam as listed above  Psychiatric: Judgment intact, Mood & affect appropriate for pt's clinical situation  Dermatologic: See M/S exam for extremity exam, no rashes otherwise noted  Lymph : No Cervical, Axillary, or Inguinal lymphadenopathy    Non-Invasive Vascular Imaging  Outside AAA Duplex (Date: 09/22/14)  Current size:  3.5 cm   R CIA: 1.5 cm  L CIA: 1.3 cm   Outside Studies/Documentation 5 pages of outside documents were reviewed including: outpatient PCP chart.   Medical Decision Making  The patient is a 59 y.o. male who presents with: small asx AAA., small asx R CIA aneurysm, L CIA ectasia, prominently palpable popliteal arteries   Based on this patient's exam and diagnostic studies, he needs surveillance..  The threshold for repair is AAA size > 5.5 cm, growth > 1 cm/yr, and symptomatic status.  The wife would like to have annual surveillance at this point.  At his one year, I would also check the popliteal arteries for aneurysmal growth.  We also discussed the importance of smoking cessation.  He is going to follow up with his PCP for referral to counseling and [place].  I emphasized the importance of maximal medical management including strict control of blood pressure, blood glucose, and lipid  levels, antiplatelet agents, obtaining regular exercise, and cessation of smoking.    Thank you for allowing [place] to participate in this patient's care.   Leonides Sake, MD [facility] Office: [phone] Pager: [phone]  10/21/2014, 3:34 PM

## 2014-10-24 NOTE — Addendum Note (Signed)
Addended by: Adria Dill L on: 10/24/2014 03:32 PM   Modules accepted: Orders

## 2014-10-24 NOTE — Addendum Note (Signed)
Addended by: Adria Dill L on: 10/24/2014 10:11 AM   Modules accepted: Orders

## 2014-10-26 ENCOUNTER — Other Ambulatory Visit: Payer: Self-pay

## 2014-10-26 NOTE — Telephone Encounter (Signed)
Last seen 10/03/14  Dr Ermalinda Memos  If approved print

## 2014-10-27 ENCOUNTER — Telehealth: Payer: Self-pay

## 2014-10-27 MED ORDER — TRAMADOL HCL 50 MG PO TABS: 50.0000 mg | ORAL_TABLET | Freq: Four times a day (QID) | ORAL | Status: DC | PRN

## 2014-10-27 NOTE — Telephone Encounter (Signed)
Pt aware rx ready

## 2014-10-27 NOTE — Telephone Encounter (Signed)
Pt aware written Rx is at front desk ready for pickup  

## 2014-10-27 NOTE — Telephone Encounter (Signed)
Refilled tramadol,  Chronic low back pain.   Murtis Sink, MD [facility] 10/27/2014, 7:53 AM

## 2014-12-09 ENCOUNTER — Ambulatory Visit: Payer: Medicare HMO | Admitting: Family Medicine

## 2014-12-13 ENCOUNTER — Encounter: Payer: Self-pay | Admitting: Family Medicine

## 2014-12-13 ENCOUNTER — Ambulatory Visit (INDEPENDENT_AMBULATORY_CARE_PROVIDER_SITE_OTHER): Payer: Medicare HMO | Admitting: Family Medicine

## 2014-12-13 VITALS — BP 160/90 | HR 51 | Temp 96.7°F | Ht 65.0 in | Wt 165.6 lb

## 2014-12-13 DIAGNOSIS — I10 Essential (primary) hypertension: Secondary | ICD-10-CM

## 2014-12-13 DIAGNOSIS — R0600 Dyspnea, unspecified: Secondary | ICD-10-CM

## 2014-12-13 DIAGNOSIS — Z720 Tobacco use: Secondary | ICD-10-CM

## 2014-12-13 MED ORDER — AMLODIPINE BESYLATE 5 MG PO TABS: 5.0000 mg | ORAL_TABLET | Freq: Every day | ORAL | Status: DC

## 2014-12-13 NOTE — Progress Notes (Signed)
   HPI  Patient presents today for routine follow-up.  Patient explains that his blood pressure has been "high" at home but he does not room of the numbers. He does not report which medications he is taking but states that he is taking all of them as prescribed.  He is in the interim been seen by vascular surgery for an abdominal aortic aneurysm, he has no abdominal pain and has been taking the recommendations very seriously.  He has cut back on his cigarette smoking by one half, he is now smoking about one half pack per day.  He denies chest pain, dyspnea, palpitations, leg edema. He does have episodic dyspnea and cough helped by albuterol.  PMH: Smoking status noted ROS: Per HPI  Objective: BP 171/93 mmHg  Pulse 51  Temp(Src) 96.7 F (35.9 C) (Oral)  Ht 5\' 5"  (1.651 m)  Wt 165 lb 9.6 oz (75.116 kg)  BMI 27.56 kg/m2 Gen: NAD, alert, cooperative with exam HEENT: NCAT CV: RRR, good S1/S2, no murmur Resp: CTABL, no wheezes, non-labored Ext: No edema, warm Neuro: Alert and oriented, No gross deficits  Assessment and plan:  # Hypertension Elevated today, improved after sitting for a few minutes Continue metoprolol Adding amlodipine Discussed blood pressure log and bring in medications Follow-up one month  # Tobacco abuse Actively cutting back, recommended he make an appointment with our clinical pharmacist to discuss smoking cessation Discussed 1800 quit now  # Dyspnea Episodic with a long history of smoking, likely COPD Albuterol as needed, will monitor albuterol use and consider prophylactic medications if it becomes frequent.   Meds ordered this encounter  Medications  . amLODipine (NORVASC) 5 MG tablet    Sig: Take 1 tablet (5 mg total) by mouth daily.    Dispense:  30 tablet    Refill:  3    Murtis SinkSam Bradshaw, MD [facility] 12/13/2014, 8:40 AM

## 2014-12-13 NOTE — Patient Instructions (Signed)
Great to see you!  Come back in 1 month for blood pressure follow up  Start amlodipine 1 pill per day, we may add more next time.   Make an appointment with our pharmacist to discuss quitting smoking Try 1800-quit now

## 2015-01-12 ENCOUNTER — Ambulatory Visit: Payer: Medicare HMO | Admitting: Family Medicine

## 2015-01-13 ENCOUNTER — Encounter: Payer: Self-pay | Admitting: Family Medicine

## 2015-02-10 ENCOUNTER — Other Ambulatory Visit: Payer: Self-pay | Admitting: Family Medicine

## 2015-02-14 ENCOUNTER — Other Ambulatory Visit: Payer: Self-pay

## 2015-02-14 DIAGNOSIS — J439 Emphysema, unspecified: Secondary | ICD-10-CM

## 2015-02-14 MED ORDER — ALBUTEROL SULFATE HFA 108 (90 BASE) MCG/ACT IN AERS: 1.0000 | INHALATION_SPRAY | Freq: Four times a day (QID) | RESPIRATORY_TRACT | Status: DC | PRN

## 2015-03-03 ENCOUNTER — Other Ambulatory Visit: Payer: Self-pay | Admitting: Family Medicine

## 2015-04-05 ENCOUNTER — Other Ambulatory Visit: Payer: Self-pay

## 2015-04-05 NOTE — Telephone Encounter (Signed)
Last seen 12/13/14  Dr Ermalinda Memos  If approved print

## 2015-04-06 MED ORDER — TRAMADOL HCL 50 MG PO TABS: 50.0000 mg | ORAL_TABLET | Freq: Four times a day (QID) | ORAL | Status: DC | PRN

## 2015-04-06 NOTE — Telephone Encounter (Signed)
Pt aware written Rx is at front desk

## 2015-05-29 ENCOUNTER — Telehealth: Payer: Self-pay | Admitting: Family Medicine

## 2015-06-13 ENCOUNTER — Other Ambulatory Visit: Payer: Self-pay | Admitting: Family Medicine

## 2015-06-15 NOTE — Telephone Encounter (Signed)
Patient last seen in office on 12-13-14. Lyrica last filled on 02-09-15 for #[id number]. Please advise. If approved please route to Pool A so nurse can call in to pharmacy

## 2015-07-04 ENCOUNTER — Telehealth: Payer: Self-pay | Admitting: Family Medicine

## 2015-08-17 ENCOUNTER — Telehealth: Payer: Self-pay | Admitting: Family Medicine

## 2015-09-19 ENCOUNTER — Ambulatory Visit: Payer: Medicare HMO

## 2015-09-21 ENCOUNTER — Telehealth: Payer: Self-pay | Admitting: Family Medicine

## 2015-09-21 ENCOUNTER — Encounter: Payer: Self-pay | Admitting: Family Medicine

## 2015-09-26 ENCOUNTER — Other Ambulatory Visit: Payer: Self-pay | Admitting: Family Medicine

## 2015-09-26 DIAGNOSIS — F32A Depression, unspecified: Secondary | ICD-10-CM

## 2015-09-26 DIAGNOSIS — F329 Major depressive disorder, single episode, unspecified: Secondary | ICD-10-CM

## 2015-10-13 ENCOUNTER — Encounter: Payer: Self-pay | Admitting: Family

## 2015-10-20 ENCOUNTER — Ambulatory Visit: Payer: Medicare HMO | Admitting: Family

## 2015-10-20 ENCOUNTER — Other Ambulatory Visit (HOSPITAL_COMMUNITY): Payer: Medicare HMO

## 2015-10-20 ENCOUNTER — Ambulatory Visit (HOSPITAL_COMMUNITY): Payer: No Typology Code available for payment source

## 2015-10-20 ENCOUNTER — Ambulatory Visit (HOSPITAL_COMMUNITY): Payer: Medicare HMO

## 2015-11-27 ENCOUNTER — Encounter: Payer: Self-pay | Admitting: Family

## 2015-11-29 ENCOUNTER — Encounter: Payer: Self-pay | Admitting: Family

## 2015-11-29 ENCOUNTER — Ambulatory Visit (INDEPENDENT_AMBULATORY_CARE_PROVIDER_SITE_OTHER): Payer: Medicare HMO | Admitting: Family

## 2015-11-29 ENCOUNTER — Ambulatory Visit (HOSPITAL_COMMUNITY)
Admission: RE | Admit: 2015-11-29 | Discharge: 2015-11-29 | Disposition: A | Discharge status: Home / Self Care | Payer: Medicare HMO | Source: Ambulatory Visit | Attending: Vascular Surgery | Admitting: Vascular Surgery

## 2015-11-29 VITALS — BP 185/97 | HR 41 | Temp 97.0°F | Resp 16 | Ht 65.0 in | Wt 156.0 lb

## 2015-11-29 DIAGNOSIS — F17200 Nicotine dependence, unspecified, uncomplicated: Secondary | ICD-10-CM | POA: Diagnosis not present

## 2015-11-29 DIAGNOSIS — I714 Abdominal aortic aneurysm, without rupture, unspecified: Secondary | ICD-10-CM

## 2015-11-29 DIAGNOSIS — I723 Aneurysm of iliac artery: Secondary | ICD-10-CM | POA: Insufficient documentation

## 2015-11-29 DIAGNOSIS — R69 Illness, unspecified: Secondary | ICD-10-CM | POA: Diagnosis not present

## 2015-11-29 DIAGNOSIS — R0989 Other specified symptoms and signs involving the circulatory and respiratory systems: Secondary | ICD-10-CM

## 2015-11-29 NOTE — Patient Instructions (Addendum)
Abdominal Aortic Aneurysm An aneurysm is a weakened or damaged part of an artery wall that bulges from the normal force of blood pumping through the body. An abdominal aortic aneurysm is an aneurysm that occurs in the lower part of the aorta, the main artery of the body.  The major concern with an abdominal aortic aneurysm is that it can enlarge and burst (rupture) or blood can flow between the layers of the wall of the aorta through a tear (aorticdissection). Both of these conditions can cause bleeding inside the body and can be life threatening unless diagnosed and treated promptly. CAUSES  The exact cause of an abdominal aortic aneurysm is unknown. Some contributing factors are:   A hardening of the arteries caused by the buildup of fat and other substances in the lining of a blood vessel (arteriosclerosis).  Inflammation of the walls of an artery (arteritis).   Connective tissue diseases, such as Marfan syndrome.   Abdominal trauma.   An infection, such as syphilis or staphylococcus, in the wall of the aorta (infectious aortitis) caused by bacteria. RISK FACTORS  Risk factors that contribute to an abdominal aortic aneurysm may include:  Age older than 60 years.   High blood pressure (hypertension).  Male gender.  Ethnicity (white race).  Obesity.  Family history of aneurysm (first degree relatives only).  Tobacco use. PREVENTION  The following healthy lifestyle habits may help decrease your risk of abdominal aortic aneurysm:  Quitting smoking. Smoking can raise your blood pressure and cause arteriosclerosis.  Limiting or avoiding alcohol.  Keeping your blood pressure, blood sugar level, and cholesterol levels within normal limits.  Decreasing your salt intake. In somepeople, too much salt can raise blood pressure and increase your risk of abdominal aortic aneurysm.  Eating a diet low in saturated fats and cholesterol.  Increasing your fiber intake by including  whole grains, vegetables, and fruits in your diet. Eating these foods may help lower blood pressure.  Maintaining a healthy weight.  Staying physically active and exercising regularly. SYMPTOMS  The symptoms of abdominal aortic aneurysm may vary depending on the size and rate of growth of the aneurysm.Most grow slowly and do not have any symptoms. When symptoms do occur, they may include:  Pain (abdomen, side, lower back, or groin). The pain may vary in intensity. A sudden onset of severe pain may indicate that the aneurysm has ruptured.  Feeling full after eating only small amounts of food.  Nausea or vomiting or both.  Feeling a pulsating lump in the abdomen.  Feeling faint or passing out. DIAGNOSIS  Since most unruptured abdominal aortic aneurysms have no symptoms, they are often discovered during diagnostic exams for other conditions. An aneurysm may be found during the following procedures:  Ultrasonography (A one-time screening for abdominal aortic aneurysm by ultrasonography is also recommended for all men aged 65-75 years who have ever smoked).  X-ray exams.  A computed tomography (CT).  Magnetic resonance imaging (MRI).  Angiography or arteriography. TREATMENT  Treatment of an abdominal aortic aneurysm depends on the size of your aneurysm, your age, and risk factors for rupture. Medication to control blood pressure and pain may be used to manage aneurysms smaller than 6 cm. Regular monitoring for enlargement may be recommended by your caregiver if:  The aneurysm is 3-4 cm in size (an annual ultrasonography may be recommended).  The aneurysm is 4-4.5 cm in size (an ultrasonography every 6 months may be recommended).  The aneurysm is larger than 4.5 cm in   size (your caregiver may ask that you be examined by a vascular surgeon). If your aneurysm is larger than 6 cm, surgical repair may be recommended. There are two main methods for repair of an aneurysm:   Endovascular  repair (a minimally invasive surgery). This is done most often.  Open repair. This method is used if an endovascular repair is not possible.   This information is not intended to replace advice given to you by your health care provider. Make sure you discuss any questions you have with your health care provider.   Document Released: 10/31/2004 Document Revised: 05/18/2012 Document Reviewed: 02/21/2012 Elsevier Interactive Patient Education 2016 Elsevier Inc.      Steps to Quit Smoking  Smoking tobacco can be harmful to your health and can affect almost every organ in your body. Smoking puts you, and those around you, at risk for developing many serious chronic diseases. Quitting smoking is difficult, but it is one of the best things that you can do for your health. It is never too late to quit. WHAT ARE THE BENEFITS OF QUITTING SMOKING? When you quit smoking, you lower your risk of developing serious diseases and conditions, such as:  Lung cancer or lung disease, such as COPD.  Heart disease.  Stroke.  Heart attack.  Infertility.  Osteoporosis and bone fractures. Additionally, symptoms such as coughing, wheezing, and shortness of breath may get better when you quit. You may also find that you get sick less often because your body is stronger at fighting off colds and infections. If you are pregnant, quitting smoking can help to reduce your chances of having a baby of low birth weight. HOW DO I GET READY TO QUIT? When you decide to quit smoking, create a plan to make sure that you are successful. Before you quit:  Pick a date to quit. Set a date within the next two weeks to give you time to prepare.  Write down the reasons why you are quitting. Keep this list in places where you will see it often, such as on your bathroom mirror or in your car or wallet.  Identify the people, places, things, and activities that make you want to smoke (triggers) and avoid them. Make sure to take  these actions:  Throw away all cigarettes at home, at work, and in your car.  Throw away smoking accessories, such as ashtrays and lighters.  Clean your car and make sure to empty the ashtray.  Clean your home, including curtains and carpets.  Tell your family, friends, and coworkers that you are quitting. Support from your loved ones can make quitting easier.  Talk with your health care provider about your options for quitting smoking.  Find out what treatment options are covered by your health insurance. WHAT STRATEGIES CAN I USE TO QUIT SMOKING?  Talk with your healthcare provider about different strategies to quit smoking. Some strategies include:  Quitting smoking altogether instead of gradually lessening how much you smoke over a period of time. Research shows that quitting "cold turkey" is more successful than gradually quitting.  Attending in-person counseling to help you build problem-solving skills. You are more likely to have success in quitting if you attend several counseling sessions. Even short sessions of 10 minutes can be effective.  Finding resources and support systems that can help you to quit smoking and remain smoke-free after you quit. These resources are most helpful when you use them often. They can include:  Online chats with a counselor.    Telephone quitlines.  Printed self-help materials.  Support groups or group counseling.  Text messaging programs.  Mobile phone applications.  Taking medicines to help you quit smoking. (If you are pregnant or breastfeeding, talk with your health care provider first.) Some medicines contain nicotine and some do not. Both types of medicines help with cravings, but the medicines that include nicotine help to relieve withdrawal symptoms. Your health care provider may recommend:  Nicotine patches, gum, or lozenges.  Nicotine inhalers or sprays.  Non-nicotine medicine that is taken by mouth. Talk with your health care  provider about combining strategies, such as taking medicines while you are also receiving in-person counseling. Using these two strategies together makes you more likely to succeed in quitting than if you used either strategy on its own. If you are pregnant or breastfeeding, talk with your health care provider about finding counseling or other support strategies to quit smoking. Do not take medicine to help you quit smoking unless told to do so by your health care provider. WHAT THINGS CAN I DO TO MAKE IT EASIER TO QUIT? Quitting smoking might feel overwhelming at first, but there is a lot that you can do to make it easier. Take these important actions:  Reach out to your family and friends and ask that they support and encourage you during this time. Call telephone quitlines, reach out to support groups, or work with a counselor for support.  Ask people who smoke to avoid smoking around you.  Avoid places that trigger you to smoke, such as bars, parties, or smoke-break areas at work.  Spend time around people who do not smoke.  Lessen stress in your life, because stress can be a smoking trigger for some people. To lessen stress, try:  Exercising regularly.  Deep-breathing exercises.  Yoga.  Meditating.  Performing a body scan. This involves closing your eyes, scanning your body from head to toe, and noticing which parts of your body are particularly tense. Purposefully relax the muscles in those areas.  Download or purchase mobile phone or tablet apps (applications) that can help you stick to your quit plan by providing reminders, tips, and encouragement. There are many free apps, such as QuitGuide from the CDC (Centers for Disease Control and Prevention). You can find other support for quitting smoking (smoking cessation) through smokefree.gov and other websites. HOW WILL I FEEL WHEN I QUIT SMOKING? Within the first 24 hours of quitting smoking, you may start to feel some withdrawal  symptoms. These symptoms are usually most noticeable 2-3 days after quitting, but they usually do not last beyond 2-3 weeks. Changes or symptoms that you might experience include:  Mood swings.  Restlessness, anxiety, or irritation.  Difficulty concentrating.  Dizziness.  Strong cravings for sugary foods in addition to nicotine.  Mild weight gain.  Constipation.  Nausea.  Coughing or a sore throat.  Changes in how your medicines work in your body.  A depressed mood.  Difficulty sleeping (insomnia). After the first 2-3 weeks of quitting, you may start to notice more positive results, such as:  Improved sense of smell and taste.  Decreased coughing and sore throat.  Slower heart rate.  Lower blood pressure.  Clearer skin.  The ability to breathe more easily.  Fewer sick days. Quitting smoking is very challenging for most people. Do not get discouraged if you are not successful the first time. Some people need to make many attempts to quit before they achieve long-term success. Do your best to stick   to your quit plan, and talk with your health care provider if you have any questions or concerns.   This information is not intended to replace advice given to you by your health care provider. Make sure you discuss any questions you have with your health care provider.   Document Released: 01/15/2001 Document Revised: 06/07/2014 Document Reviewed: 06/07/2014 Elsevier Interactive Patient Education 2016 Elsevier Inc.     Before your next abdominal ultrasound:  Take two Extra-Strength Gas-X capsules at bedtime the night before the test. Take another two Extra-Strength Gas-X capsules 3 hours before the test.   

## 2015-11-29 NOTE — Progress Notes (Signed)
[facility] [place]   CC: Follow up Abdominal Aortic Aneurysm  History of Present Illness  Frank Gill is a 60 y.o. (Sep 25, 1955) male patient whom Dr. Imogene Burn saw on initial evaluation on 10/21/14 for small AAA.  Previous studies demonstrate an AAA, measuring 3.4 cm.  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  The patient does not have back or abdominal pain.  The patient notes no history of embolic episodes from the AAA.  The patient's risk factors for AAA included: age, male sex, and active smoking. On exam at that visit he had prominent popliteal pulses.   He c/o right arm and leg tingling and pain for about a year that is getting worse. He has not mentioned this to his PCP, I advised him to discuss this with is PCP.   The patient denies claudication in legs with walking. The patient denies any history of stroke or TIA symptoms.  He walks about 2 hours/day in addition to yard work.   Pt states he has run out of all of his medications; I advised him to see his PCP ASAP re this.   He denies any known cardiac problems.   Pt states he is unable to read very well, but his friend can read instructions to him.    Pt Diabetic: No Pt smoker: smoker  (3-4 cigars/day in addition to a nicotine vapor product , started in 1981)  Past Medical History:  Diagnosis Date  . Abscess of back   . Arthritis   . Bradycardia   . Constipation   . Dyslipidemia   . Dyspnea   . Exertional shortness of breath   . Hyperglycemia   . Hyperlipidemia   . Left sided sciatica   . Leukocytosis, unspecified   . Major depression, recurrent (HCC)   . Near syncope   . PTSD (post-traumatic stress disorder)   . Shortness of breath   . Syncope   . Vaso vagal episode    Past Surgical History:  Procedure Laterality Date  . CARDIAC CATHETERIZATION    . CYST EXCISION    . LEFT HEART CATHETERIZATION WITH CORONARY ANGIOGRAM N/A 02/09/2013   Procedure: LEFT HEART  CATHETERIZATION WITH CORONARY ANGIOGRAM;  Surgeon: Marykay Lex, MD;  Location: [facility] CATH LAB;  Service: Cardiovascular;  Laterality: N/A;  . VASECTOMY     Social History Social History   Social History  . Marital status: Divorced    Spouse name: N/A  . Number of children: 2  . Years of education: N/A   Occupational History  . Not on file.   Social History Main Topics  . Smoking status: Current Every Day Smoker    Packs/day: 1.00    Years: 30.00    Types: Cigars    Start date: 05/21/1979  . Smokeless tobacco: Never Used     Comment: uses e-sig, occasional cigar.   . Alcohol use 1.2 oz/week    2 Cans of beer per week     Comment: occ  . Drug use: No  . Sexual activity: Not on file   Other Topics Concern  . Not on file   Social History Narrative  . No narrative on file   Family History Family History  Problem Relation Age of Onset  . Heart disease Mother   . Hypertension Mother   . Hyperlipidemia Mother   . Heart attack Mother   . Mental illness Sister   .  Leukemia Father   . Cancer Father   . Hyperlipidemia Father   . Hypertension Father     Current Outpatient Prescriptions on File Prior to Visit  Medication Sig Dispense Refill  . aspirin EC 325 MG tablet Take 325 mg by mouth daily.    Marland Kitchen. albuterol (PROVENTIL HFA;VENTOLIN HFA) 108 (90 Base) MCG/ACT inhaler Inhale 1-2 puffs into the lungs every 6 (six) hours as needed for shortness of breath. (Patient not taking: Reported on 11/29/2015) 1 Inhaler 3  . amLODipine (NORVASC) 5 MG tablet Take 1 tablet (5 mg total) by mouth daily. (Patient not taking: Reported on 11/29/2015) 30 tablet 3  . atorvastatin (LIPITOR) 40 MG tablet Take 1 tablet (40 mg total) by mouth daily at 6 PM. (Patient not taking: Reported on 11/29/2015) 90 tablet 3  . atorvastatin (LIPITOR) 40 MG tablet TAKE ONE TABLET BY MOUTH ONCE DAILY AT 6 PM (Patient not taking: Reported on 11/29/2015) 30 tablet 0  . Cholecalciferol (VITAMIN D) 2000 UNITS CAPS Take  2,000 Units by mouth every evening.    . citalopram (CELEXA) 20 MG tablet Take 1 tablet (20 mg total) by mouth daily. (Patient not taking: Reported on 11/29/2015) 90 tablet 3  . docusate sodium (COLACE) 100 MG capsule Take 200 mg by mouth as needed.    . Fenofibrate 150 MG CAPS Take 1 capsule (150 mg total) by mouth at bedtime. (Patient not taking: Reported on 11/29/2015) 90 each 3  . hydrocortisone (PROCTOSOL HC) 2.5 % rectal cream Place 1 application rectally 2 (two) times daily. (Patient not taking: Reported on 11/29/2015) 30 g 11  . loratadine (CLARITIN) 10 MG tablet TAKE ONE TABLET BY MOUTH ONCE DAILY (Patient not taking: Reported on 11/29/2015) 90 tablet 0  . LYRICA 75 MG capsule TAKE ONE CAPSULE BY MOUTH TWICE DAILY (Patient not taking: Reported on 11/29/2015) 180 capsule 0  . metoprolol succinate (TOPROL-XL) 50 MG 24 hr tablet TAKE ONE TABLET BY MOUTH ONCE DAILY (Patient not taking: Reported on 11/29/2015) 90 tablet 0  . Multiple Vitamin (MULTIVITAMIN WITH MINERALS) TABS tablet Take 1 tablet by mouth daily.    . traMADol (ULTRAM) 50 MG tablet Take 1 tablet (50 mg total) by mouth every 6 (six) hours as needed. (Patient not taking: Reported on 11/29/2015) 60 tablet 0   No current facility-administered medications on file prior to visit.    Allergies  Allergen Reactions  . Poison Oak Extract [Poison Oak Extract]     ROS: See HPI for pertinent positives and negatives.  Physical Examination  Vitals:   11/29/15 0955 11/29/15 1000  BP: (!) 179/101 (!) 185/97  Pulse: (!) 41   Resp: 16   Temp: 97 F (36.1 C)   TempSrc: Oral   SpO2: 100%   Weight: 156 lb (70.8 kg)   Height: 5\' 5"  (1.651 m)    Body mass index is 25.96 kg/m.  General: A&O x 3, WDWN  Head: [place]/AT  Eyes: PERRLA  Pulmonary: Sym exp, respirations are non labored, good air movt, CTAB, no rales, rhonchi, or wheezing  Cardiac: Regular rhythm, bradycardic, Nl S1, S2, no detected murmur  Vascular: Vessel Right  Left  Radial Palpable Palpable  Brachial Palpable Palpable  Carotid Palpable, without bruit Palpable, without bruit  Aorta Not palpable N/A  Femoral Palpable Palpable  Popliteal Prominently palpable Prominently palpable  PT Palpable Palpable  DP Palpable Palpable   Gastrointestinal: soft, NTND, no G/R, no HSM, no palpable masses, no CVAT B  Musculoskeletal: M/S 5/5 throughout ,  Extremities without ischemic changes   Neurologic: CN 2-12 intact , Pain and light touch intact in extremities , Motor exam as listed above  Psychiatric: Judgment intact, Mood & affect appropriate for pt's clinical situation  Dermatologic: See M/S exam for extremity exam, no rashes otherwise noted     Non-Invasive Vascular Imaging  AAA Duplex (11/29/2015)  Previous size: 3.4 cm (Date: 09/22/14), [facility]  Current size:  2.8 cm (Date: 11/29/15). Decreased visualization of the abdominal vasculature due to overlying bowel gas. No significant stenosis.   Right CIA: 1.6 cm, left CIA: 1.6 cm.   Popliteal artery duplex: No aneurysmal dilatation of the bilateral popliteal arteries.   Medical Decision Making  The patient is a 60 y.o. male who presents with asymptomatic small AAA with no increase in size, based on limited visualization. Prominent popliteal pulses: no aneurysmal dilatation.   I advised pt to see his PCP as soon as possible re his elevated blood pressure and his report that he ran out of all of his medications.   He is also bradycardic, but is asymptomatic.   The patient was counseled re smoking cessation and given several free resources re smoking cessation.    Based on this patient's exam and diagnostic studies, the patient will follow up in 1 year  with the following studies: AAA duplex.  Consideration for repair of AAA would be made when the size is 5.0 cm, growth > 1 cm/yr, and symptomatic status.  I emphasized the importance of maximal medical management including  strict control of blood pressure, blood glucose, and lipid levels, antiplatelet agents, obtaining regular exercise, and  cessation of smoking.   The patient was given information about AAA including signs, symptoms, treatment, and how to minimize the risk of enlargement and rupture of aneurysms.    The patient was advised to call 911 should the patient experience sudden onset abdominal or back pain.   Thank you for allowing [place] to participate in this patient's care.  Charisse March, RN, MSN, FNP-C [facility] [place] Office: [phone]  Clinic Physician: Imogene Burn  11/29/2015, 10:04 AM

## 2015-12-11 ENCOUNTER — Encounter: Payer: Self-pay | Admitting: Family Medicine

## 2015-12-11 ENCOUNTER — Ambulatory Visit (INDEPENDENT_AMBULATORY_CARE_PROVIDER_SITE_OTHER): Payer: Medicare HMO | Admitting: Family Medicine

## 2015-12-11 VITALS — BP 163/96 | HR 57 | Temp 97.1°F | Ht 65.0 in | Wt 154.8 lb

## 2015-12-11 DIAGNOSIS — J439 Emphysema, unspecified: Secondary | ICD-10-CM

## 2015-12-11 DIAGNOSIS — R69 Illness, unspecified: Secondary | ICD-10-CM | POA: Diagnosis not present

## 2015-12-11 DIAGNOSIS — I10 Essential (primary) hypertension: Secondary | ICD-10-CM | POA: Diagnosis not present

## 2015-12-11 DIAGNOSIS — E785 Hyperlipidemia, unspecified: Secondary | ICD-10-CM

## 2015-12-11 DIAGNOSIS — I714 Abdominal aortic aneurysm, without rupture, unspecified: Secondary | ICD-10-CM

## 2015-12-11 DIAGNOSIS — Z23 Encounter for immunization: Secondary | ICD-10-CM | POA: Diagnosis not present

## 2015-12-11 DIAGNOSIS — F339 Major depressive disorder, recurrent, unspecified: Secondary | ICD-10-CM | POA: Diagnosis not present

## 2015-12-11 MED ORDER — ALBUTEROL SULFATE HFA 108 (90 BASE) MCG/ACT IN AERS: 1.0000 | INHALATION_SPRAY | Freq: Four times a day (QID) | RESPIRATORY_TRACT | 3 refills | Status: DC | PRN

## 2015-12-11 MED ORDER — ATORVASTATIN CALCIUM 40 MG PO TABS
40.0000 mg | ORAL_TABLET | Freq: Every day | ORAL | 3 refills | Status: DC
Start: 2015-12-11 — End: 2016-12-12

## 2015-12-11 MED ORDER — CITALOPRAM HYDROBROMIDE 20 MG PO TABS: 20.0000 mg | ORAL_TABLET | Freq: Every day | ORAL | 3 refills | Status: DC

## 2015-12-11 MED ORDER — AMLODIPINE BESYLATE 5 MG PO TABS: 5.0000 mg | ORAL_TABLET | Freq: Every day | ORAL | 3 refills | Status: DC

## 2015-12-11 MED ORDER — AMLODIPINE BESYLATE 10 MG PO TABS: 10.0000 mg | ORAL_TABLET | Freq: Every day | ORAL | 3 refills | Status: DC

## 2015-12-11 MED ORDER — METOPROLOL SUCCINATE ER 50 MG PO TB24: 50.0000 mg | ORAL_TABLET | Freq: Every day | ORAL | 3 refills | Status: DC

## 2015-12-11 MED ORDER — FENOFIBRATE 150 MG PO CAPS: 150.0000 mg | ORAL_CAPSULE | Freq: Every day | ORAL | 3 refills | Status: DC

## 2015-12-11 NOTE — Progress Notes (Signed)
   HPI  Patient presents today here for annual physical exam and to follow-up for chronic medical conditions.  Patient's been out of medications for about 2 months. Depression Feels sad after breaking up with his girlfriend, he would like to restart medication.  No Suicidal thoughts  Smoking He is contemplative, will consider medications.  Hypertension No chest pain, dyspnea, palpitations. Taking metoprolol only. Needs refills.  Abdominal aortic aneurysm Reports recent visit with vascular surgery   PMH: Smoking status noted ROS: Per HPI  Objective: BP (!) 163/96   Pulse (!) 57   Temp 97.1 F (36.2 C) (Oral)   Ht '5\' 5"'$  (1.651 m)   Wt 154 lb 12.8 oz (70.2 kg)   BMI 25.76 kg/m  Gen: NAD, alert, cooperative with exam HEENT: NCAT CV: RRR, good S1/S2, no murmur Resp: CTABL, no wheezes, non-labored Abd: SNTND, BS present, no guarding or organomegaly Ext: No edema, warm Neuro: Alert and oriented, No gross deficits  Assessment and plan:  # Hypertension Elevated today, patient is out of medication, specifically amlodipine Refill amlodipine at 10 mg, follow-up one month Continuemetoprolol, this is likely causing the bradycardia. This is the only medication he has left.  #depression Restart celexa, worse since his break up and off of meds F/u 1 month No SI  # AAA Has had gpood follow up for this Emphasized stopping smoking and controlling BP  # Pilmonary emphysema Refill albuterol Stop smoking- pt considering. With uncotrolled depression chantix would be too risky currently  Flu shot given, counseling provided for all components    Orders Placed This Encounter  Procedures  . Flu Vaccine QUAD 36+ mos IM  . CMP14+EGFR  . CBC with Differential/Platelet  . Lipid panel    Meds ordered this encounter  Medications  . albuterol (PROVENTIL HFA;VENTOLIN HFA) 108 (90 Base) MCG/ACT inhaler    Sig: Inhale 1-2 puffs into the lungs every 6 (six) hours as needed for  shortness of breath.    Dispense:  1 Inhaler    Refill:  3  . DISCONTD: amLODipine (NORVASC) 5 MG tablet    Sig: Take 1 tablet (5 mg total) by mouth daily.    Dispense:  90 tablet    Refill:  3  . atorvastatin (LIPITOR) 40 MG tablet    Sig: Take 1 tablet (40 mg total) by mouth daily at 6 PM.    Dispense:  90 tablet    Refill:  3  . citalopram (CELEXA) 20 MG tablet    Sig: Take 1 tablet (20 mg total) by mouth daily.    Dispense:  90 tablet    Refill:  3  . Fenofibrate 150 MG CAPS    Sig: Take 1 capsule (150 mg total) by mouth at bedtime.    Dispense:  90 each    Refill:  3  . metoprolol succinate (TOPROL-XL) 50 MG 24 hr tablet    Sig: Take 1 tablet (50 mg total) by mouth daily. Take with or immediately following a meal.    Dispense:  90 tablet    Refill:  3  . amLODipine (NORVASC) 10 MG tablet    Sig: Take 1 tablet (10 mg total) by mouth daily.    Dispense:  90 tablet    Refill:  3    Please disregard previous 5 mg tab, increasing to 10 mg.    Laroy Apple, MD [facility] 12/11/2015, 11:35 AM

## 2015-12-11 NOTE — Patient Instructions (Addendum)
Great to see you!  Re-start your blood pressure medications, I have sent refills.   Lets follow up in 1 month to make sure everything is going well.

## 2015-12-12 LAB — CBC WITH DIFFERENTIAL/PLATELET
Basophils Absolute: 0 10*3/uL (ref 0.0–0.2)
Basos: 1 %
EOS (ABSOLUTE): 0.2 10*3/uL (ref 0.0–0.4)
EOS: 4 %
HEMATOCRIT: 45.5 % (ref 37.5–51.0)
HEMOGLOBIN: 15.2 g/dL (ref 12.6–17.7)
IMMATURE GRANS (ABS): 0 10*3/uL (ref 0.0–0.1)
IMMATURE GRANULOCYTES: 0 %
LYMPHS ABS: 1.5 10*3/uL (ref 0.7–3.1)
Lymphs: 28 %
MCH: 31.5 pg (ref 26.6–33.0)
MCHC: 33.4 g/dL (ref 31.5–35.7)
MCV: 94 fL (ref 79–97)
MONOCYTES: 9 %
Monocytes Absolute: 0.5 10*3/uL (ref 0.1–0.9)
NEUTROS PCT: 58 %
Neutrophils Absolute: 3.3 10*3/uL (ref 1.4–7.0)
Platelets: 273 10*3/uL (ref 150–379)
RBC: 4.83 x10E6/uL (ref 4.14–5.80)
RDW: 13.5 % (ref 12.3–15.4)
WBC: 5.6 10*3/uL (ref 3.4–10.8)

## 2015-12-12 LAB — CMP14+EGFR
ALBUMIN: 4.6 g/dL (ref 3.6–4.8)
ALT: 37 IU/L (ref 0–44)
AST: 30 IU/L (ref 0–40)
Albumin/Globulin Ratio: 2 (ref 1.2–2.2)
Alkaline Phosphatase: 70 IU/L (ref 39–117)
BILIRUBIN TOTAL: 0.6 mg/dL (ref 0.0–1.2)
BUN / CREAT RATIO: 15 (ref 10–24)
BUN: 11 mg/dL (ref 8–27)
CHLORIDE: 97 mmol/L (ref 96–106)
CO2: 27 mmol/L (ref 18–29)
CREATININE: 0.74 mg/dL — AB (ref 0.76–1.27)
Calcium: 9.6 mg/dL (ref 8.6–10.2)
GFR calc non Af Amer: 100 mL/min/{1.73_m2} (ref >59)
GFR, EST AFRICAN AMERICAN: 115 mL/min/{1.73_m2} (ref >59)
GLOBULIN, TOTAL: 2.3 g/dL (ref 1.5–4.5)
GLUCOSE: 93 mg/dL (ref 65–99)
Potassium: 3.8 mmol/L (ref 3.5–5.2)
SODIUM: 140 mmol/L (ref 134–144)
TOTAL PROTEIN: 6.9 g/dL (ref 6.0–8.5)

## 2015-12-12 LAB — LIPID PANEL
CHOL/HDL RATIO: 5 ratio (ref 0.0–5.0)
Cholesterol, Total: 185 mg/dL (ref 100–199)
HDL: 37 mg/dL — AB (ref >39)
LDL CALC: 130 mg/dL — AB (ref 0–99)
TRIGLYCERIDES: 92 mg/dL (ref 0–149)
VLDL CHOLESTEROL CAL: 18 mg/dL (ref 5–40)

## 2015-12-14 ENCOUNTER — Other Ambulatory Visit: Payer: Self-pay | Admitting: Family Medicine

## 2015-12-14 DIAGNOSIS — K640 First degree hemorrhoids: Secondary | ICD-10-CM

## 2015-12-14 MED ORDER — LORATADINE 10 MG PO TABS: 10.0000 mg | ORAL_TABLET | Freq: Every day | ORAL | 3 refills | Status: DC

## 2015-12-14 MED ORDER — DOCUSATE SODIUM 100 MG PO CAPS: 200.0000 mg | ORAL_CAPSULE | ORAL | 2 refills | Status: DC | PRN

## 2015-12-14 MED ORDER — HYDROCORTISONE 2.5 % RE CREA: 1.0000 "application " | TOPICAL_CREAM | Freq: Two times a day (BID) | RECTAL | 5 refills | Status: DC

## 2015-12-14 NOTE — Telephone Encounter (Signed)
done

## 2015-12-26 NOTE — Addendum Note (Signed)
Addended by: Burton ApleyPETTY, Shalon Councilman A on: 12/26/2015 02:06 PM   Modules accepted: Orders

## 2016-03-21 ENCOUNTER — Encounter: Payer: Self-pay | Admitting: Family Medicine

## 2016-03-21 ENCOUNTER — Ambulatory Visit (INDEPENDENT_AMBULATORY_CARE_PROVIDER_SITE_OTHER): Payer: Medicare HMO | Admitting: Family Medicine

## 2016-03-21 VITALS — BP 138/82 | HR 56 | Temp 96.8°F | Ht 65.0 in | Wt 153.6 lb

## 2016-03-21 DIAGNOSIS — M25512 Pain in left shoulder: Secondary | ICD-10-CM | POA: Diagnosis not present

## 2016-03-21 NOTE — Patient Instructions (Signed)
Great to see you!  Try 1-2 tylenol before you walk to see if the pain will not come as you walk.   I think the creams are a good idea. Aspercreme or Biofreeze are good too.   Come back if it is getting worse, you are having pain or weakness in the arm, or if it is not resolving as hoped.

## 2016-03-21 NOTE — Progress Notes (Signed)
   HPI  Patient presents today with left shoulder pain.  Patient explains that for the last 2-3 months he's had left shoulder pain. He describes it as an achy type pain in the muscle between his left neck and his left deltoid area. It hurts worse with walking long distances, he walks 2-3 miles on most days.  She has not tried any medications, he has a friend who has given him a muscle ache cream which she has not tried yet. He does not routinely take pain medications. He states that he does not have any hand or arm symptoms. He does not have hand weakness. The pain gets worse after he walks. Otherwise he sleeps well and does not have any concerns regarding the neck/shoulder area   PMH: Smoking status noted ROS: Per HPI  Objective: BP 138/82   Pulse (!) 56   Temp (!) 96.8 F (36 C) (Oral)   Ht 5\' 5"  (1.651 m)   Wt 153 lb 9.6 oz (69.7 kg)   BMI 25.56 kg/m  Gen: NAD, alert, cooperative with exam HEENT: NCAT CV: RRR, good S1/S2, no murmur Resp: CTABL, no wheezes, non-labored Ext: No edema, warm Neuro: Alert and oriented, strength 5/5 and sensation intact in bilateral upper extremities and grip Musculoskeletal Negative empty can, Hawkins test, mild tenderness to palpation over the trapezius between the left neck and left shoulder Full range of motion of neck, mild tenderness with moving the neck to the left about 75-90.  Assessment and plan:  # Left shoulder pain Most likely left trapezius pain, possibly related to neck OA or shoulder OA. Preserved strength, no radicular symptoms Treat very conservatively to begin with, recommended Tylenol before exercising and trying the muscle rub creams. Low threshold for follow-up if symptoms worsen, don't improve, or he has any hand symptoms.   Murtis SinkSam Iriel Nason, MD [facility] 03/21/2016, 11:50 AM

## 2016-04-05 DIAGNOSIS — R69 Illness, unspecified: Secondary | ICD-10-CM | POA: Diagnosis not present

## 2016-04-05 DIAGNOSIS — K649 Unspecified hemorrhoids: Secondary | ICD-10-CM | POA: Diagnosis not present

## 2016-04-05 DIAGNOSIS — Z0000 Encounter for general adult medical examination without abnormal findings: Secondary | ICD-10-CM | POA: Diagnosis not present

## 2016-04-05 DIAGNOSIS — J4530 Mild persistent asthma, uncomplicated: Secondary | ICD-10-CM | POA: Diagnosis not present

## 2016-04-05 DIAGNOSIS — J3089 Other allergic rhinitis: Secondary | ICD-10-CM | POA: Diagnosis not present

## 2016-04-05 DIAGNOSIS — E784 Other hyperlipidemia: Secondary | ICD-10-CM | POA: Diagnosis not present

## 2016-04-05 DIAGNOSIS — Z79891 Long term (current) use of opiate analgesic: Secondary | ICD-10-CM | POA: Diagnosis not present

## 2016-04-05 DIAGNOSIS — I10 Essential (primary) hypertension: Secondary | ICD-10-CM | POA: Diagnosis not present

## 2016-04-05 DIAGNOSIS — J439 Emphysema, unspecified: Secondary | ICD-10-CM | POA: Diagnosis not present

## 2016-04-05 DIAGNOSIS — M545 Low back pain: Secondary | ICD-10-CM | POA: Diagnosis not present

## 2016-05-06 ENCOUNTER — Telehealth: Payer: Self-pay | Admitting: Family Medicine

## 2016-05-14 NOTE — Telephone Encounter (Signed)
Scheduled

## 2016-05-27 ENCOUNTER — Encounter: Payer: Self-pay | Admitting: Family Medicine

## 2016-05-27 ENCOUNTER — Ambulatory Visit (INDEPENDENT_AMBULATORY_CARE_PROVIDER_SITE_OTHER): Payer: Medicare HMO | Admitting: Family Medicine

## 2016-05-27 VITALS — BP 139/87 | HR 56 | Temp 96.5°F | Ht 65.0 in | Wt 155.8 lb

## 2016-05-27 DIAGNOSIS — F339 Major depressive disorder, recurrent, unspecified: Secondary | ICD-10-CM | POA: Diagnosis not present

## 2016-05-27 DIAGNOSIS — G8929 Other chronic pain: Secondary | ICD-10-CM | POA: Diagnosis not present

## 2016-05-27 DIAGNOSIS — M25511 Pain in right shoulder: Secondary | ICD-10-CM | POA: Diagnosis not present

## 2016-05-27 DIAGNOSIS — E785 Hyperlipidemia, unspecified: Secondary | ICD-10-CM

## 2016-05-27 DIAGNOSIS — M25512 Pain in left shoulder: Secondary | ICD-10-CM | POA: Diagnosis not present

## 2016-05-27 DIAGNOSIS — E781 Pure hyperglyceridemia: Secondary | ICD-10-CM

## 2016-05-27 DIAGNOSIS — R69 Illness, unspecified: Secondary | ICD-10-CM | POA: Diagnosis not present

## 2016-05-27 NOTE — Patient Instructions (Signed)
Great to see you!  Lets follow up in 6 months unless you need [place] sooner.   It is ok to stop fenofibrate.

## 2016-05-27 NOTE — Progress Notes (Signed)
   HPI  Patient presents today here to follow-up for chronic medical conditions.  Patient states overall he feels well, he actually feels much better he's been walking about one hour daily. He is living with his sister for the last 3-4 months and states that this is how he copes with stress of living with her. She sends in a note requesting a handicap sticker, naproxen and complaining of severe right neck pain patient.  She states that he walks actually 3-4 times a day walking about one hour at a time.  Patient complains of mild intermittent bilateral shoulder pain helped by aspirin or Tylenol. .  Patient's tolerating Lipitor well. He has stopped fenofibrate after we reviewed his medications that he brought.  PMH: Smoking status noted ROS: Per HPI  Objective: BP 139/87   Pulse (!) 56   Temp (!) 96.5 F (35.8 C) (Oral)   Ht '5\' 5"'$  (1.651 m)   Wt 155 lb 12.8 oz (70.7 kg)   BMI 25.93 kg/m  Gen: NAD, alert, cooperative with exam HEENT: NCAT CV: RRR, good S1/S2, no murmur Resp: CTABL, no wheezes, non-labored Ext: No edema, warm Neuro: Alert and oriented, No gross deficits  Depression screen [facility] 2/9 05/27/2016 03/21/2016 12/11/2015 12/13/2014 09/08/2014  Decreased Interest 1 0 0 0 0  Down, Depressed, Hopeless 0 0 0 0 0  PHQ - 2 Score 1 0 0 0 0     Assessment and plan:  # Hyperlipidemia Patient restarted Lipitor last visit, repeat labs today Patient is much more active.  # For triglyceridemia History of, only mild levels are recorded in the chart, stopped fenofibrate about 6 months ago, repeat labs today  # Chronic bilateral shoulder pain Mild to moderate, intermittent, patient states he does well with OTC medications.  Her seen situation with his sister requesting handicap placard and complaining of more severe pain than he is complaining of. I discussed need of handicap placard and he declines. I disciussed an NSAID for PRN use, he declines that Rx.    Depression Doing  well on citalopram, no change.   Orders Placed This Encounter  Procedures  . CMP14+EGFR  . Lipid panel    Meds ordered this encounter  Medications  . Omega-3 Fatty Acids (FISH OIL) 1000 MG CAPS    Sig: Take by mouth.    Laroy Apple, MD [facility] 05/27/2016, 9:50 AM

## 2016-05-28 LAB — CMP14+EGFR
A/G RATIO: 2 (ref 1.2–2.2)
ALK PHOS: 89 IU/L (ref 39–117)
ALT: 30 IU/L (ref 0–44)
AST: 32 IU/L (ref 0–40)
Albumin: 4.5 g/dL (ref 3.6–4.8)
BUN / CREAT RATIO: 16 (ref 10–24)
BUN: 12 mg/dL (ref 8–27)
Bilirubin Total: 0.3 mg/dL (ref 0.0–1.2)
CHLORIDE: 101 mmol/L (ref 96–106)
CO2: 26 mmol/L (ref 18–29)
Calcium: 9.2 mg/dL (ref 8.6–10.2)
Creatinine, Ser: 0.75 mg/dL — ABNORMAL LOW (ref 0.76–1.27)
GFR calc non Af Amer: 98 mL/min/{1.73_m2} (ref >59)
GFR, EST AFRICAN AMERICAN: 114 mL/min/{1.73_m2} (ref >59)
Globulin, Total: 2.2 g/dL (ref 1.5–4.5)
Glucose: 84 mg/dL (ref 65–99)
Potassium: 3.8 mmol/L (ref 3.5–5.2)
Sodium: 143 mmol/L (ref 134–144)
TOTAL PROTEIN: 6.7 g/dL (ref 6.0–8.5)

## 2016-05-28 LAB — LIPID PANEL
Chol/HDL Ratio: 2.9 ratio (ref 0.0–5.0)
Cholesterol, Total: 126 mg/dL (ref 100–199)
HDL: 44 mg/dL (ref >39)
LDL Calculated: 67 mg/dL (ref 0–99)
Triglycerides: 76 mg/dL (ref 0–149)
VLDL Cholesterol Cal: 15 mg/dL (ref 5–40)

## 2016-07-05 ENCOUNTER — Ambulatory Visit: Payer: Medicare HMO | Admitting: Family Medicine

## 2016-07-08 ENCOUNTER — Encounter: Payer: Self-pay | Admitting: Family Medicine

## 2016-07-09 ENCOUNTER — Ambulatory Visit: Payer: Medicare HMO | Admitting: Family Medicine

## 2016-07-10 ENCOUNTER — Ambulatory Visit: Payer: Medicare HMO | Admitting: Family Medicine

## 2016-07-11 ENCOUNTER — Telehealth: Payer: Self-pay | Admitting: Family Medicine

## 2016-07-11 ENCOUNTER — Encounter: Payer: Self-pay | Admitting: Family Medicine

## 2016-07-11 NOTE — Telephone Encounter (Signed)
Attempted to return call - NA 

## 2016-07-11 NOTE — Telephone Encounter (Signed)
Left message to call back for missed appointment.

## 2016-07-15 ENCOUNTER — Ambulatory Visit (INDEPENDENT_AMBULATORY_CARE_PROVIDER_SITE_OTHER): Payer: Medicare HMO | Admitting: Family Medicine

## 2016-07-15 ENCOUNTER — Encounter: Payer: Self-pay | Admitting: Family Medicine

## 2016-07-15 VITALS — BP 118/71 | HR 57 | Temp 97.0°F | Ht 65.0 in | Wt 155.2 lb

## 2016-07-15 DIAGNOSIS — G8929 Other chronic pain: Secondary | ICD-10-CM

## 2016-07-15 DIAGNOSIS — I10 Essential (primary) hypertension: Secondary | ICD-10-CM | POA: Diagnosis not present

## 2016-07-15 DIAGNOSIS — E785 Hyperlipidemia, unspecified: Secondary | ICD-10-CM

## 2016-07-15 DIAGNOSIS — M25512 Pain in left shoulder: Secondary | ICD-10-CM

## 2016-07-15 NOTE — Patient Instructions (Signed)
Great to see you!  Come back in 6 months unless you need us sooner.   Re-start lipitor, please call with any questions or concerns about it.

## 2016-07-15 NOTE — Progress Notes (Signed)
   HPI  Patient presents today to discuss shoulder pain, hyperlipidemia, and hypertension.  Hyperlipidemia, patient has ran out of medications. His significant other is concerned that it causes cancer, liver swelling, and leg pain. He had a long discussion about usual side effects.  Shoulder pain Improved quite a bit, patient with intermittent occasional shoulder pain. States that he thinks that someone said he needed surgery. He is not concerned about the shoulder at this time. Also has intermittent back pain, however not serious.  Hypertension. Good medication compliance. Patient is unsure of which medications he is taking today, however he believes he is taking them all regularly. No chest pain  PMH: Smoking status noted ROS: Per HPI  Objective: BP 118/71   Pulse (!) 57   Temp 97 F (36.1 C) (Oral)   Ht 5\' 5"  (1.651 m)   Wt 155 lb 3.2 oz (70.4 kg)   BMI 25.83 kg/m  Gen: NAD, alert, cooperative with exam HEENT: NCAT CV: RRR, good S1/S2, no murmur Resp: CTABL, no wheezes, non-labored Ext: No edema, warm Neuro: Alert and oriented, No gross deficits MSK:  No tenderness to palpation of the left shoulder, negative empty can and Hawkins sign  Assessment and plan:  # Chronic left shoulder pain Mild to moderate, intermittent No signs of need of surgical intervention at this time, patient is unsure where he hurt this.  # Dyslipidemia Previous elevated 61 year old ASCVD score, previously doing very well on Lipitor. Repeat labs were normal. Restart Lipitor  # Hypertension Well-controlled No change in medications Patient will call in with updated medication reconciliation list   Very brief note was written that he may receive his Social Security check directly. Patient is alert and oriented 4. He does not have any auditory or visual hallucinations.   Murtis SinkSam Bradshaw, MD [facility] 07/15/2016, 2:48 PM

## 2016-08-23 ENCOUNTER — Emergency Department (HOSPITAL_COMMUNITY)
Admission: EM | Admit: 2016-08-23 | Discharge: 2016-08-23 | Disposition: A | Discharge status: Home / Self Care | Payer: Medicare HMO | Attending: Emergency Medicine | Admitting: Emergency Medicine

## 2016-08-23 ENCOUNTER — Encounter (HOSPITAL_COMMUNITY): Payer: Self-pay | Admitting: Emergency Medicine

## 2016-08-23 DIAGNOSIS — M7989 Other specified soft tissue disorders: Secondary | ICD-10-CM | POA: Diagnosis not present

## 2016-08-23 DIAGNOSIS — L089 Local infection of the skin and subcutaneous tissue, unspecified: Secondary | ICD-10-CM | POA: Diagnosis not present

## 2016-08-23 DIAGNOSIS — I10 Essential (primary) hypertension: Secondary | ICD-10-CM | POA: Diagnosis not present

## 2016-08-23 DIAGNOSIS — Z79899 Other long term (current) drug therapy: Secondary | ICD-10-CM | POA: Insufficient documentation

## 2016-08-23 DIAGNOSIS — R69 Illness, unspecified: Secondary | ICD-10-CM | POA: Diagnosis not present

## 2016-08-23 DIAGNOSIS — Z7982 Long term (current) use of aspirin: Secondary | ICD-10-CM | POA: Insufficient documentation

## 2016-08-23 DIAGNOSIS — R21 Rash and other nonspecific skin eruption: Secondary | ICD-10-CM | POA: Diagnosis present

## 2016-08-23 DIAGNOSIS — F17290 Nicotine dependence, other tobacco product, uncomplicated: Secondary | ICD-10-CM | POA: Diagnosis not present

## 2016-08-23 MED ORDER — DOXYCYCLINE HYCLATE 100 MG PO CAPS: 100.0000 mg | ORAL_CAPSULE | Freq: Two times a day (BID) | ORAL | 0 refills | Status: AC

## 2016-08-23 NOTE — ED Triage Notes (Signed)
Pt c/o tick bite to the left arm. Pt states he removed the tick one week ago.

## 2016-08-23 NOTE — ED Notes (Signed)
Pt states he removed a tick from his arm a little over a week ago. Pt has a warm, red area about 3" x 4" to the posterior left arm.

## 2016-08-23 NOTE — ED Notes (Signed)
Pt alert & oriented x4, stable gait. Patient given discharge instructions, paperwork & prescription(s). Patient  instructed to stop at the registration desk to finish any additional paperwork. Patient verbalized understanding. Pt left department w/ no further questions. 

## 2016-08-24 NOTE — ED Provider Notes (Signed)
[facility]-EMER[facility]ENCY DEPT Provider Note   CSN: [id number] Arrival date & time: 08/23/16  1942     History   Chief Complaint Chief Complaint  Patient presents with  . Tick Removal    HPI Frank Gill is a 61 y.o. male.  The history is provided by the patient. No language interpreter was used.  Rash   This is a new problem. The current episode started more than 1 week ago. The problem has been gradually worsening. The problem is associated with nothing. There has been no fever. The rash is present on the left arm. The pain is moderate. The pain has been constant since onset. He has tried nothing for the symptoms. The treatment provided no relief.  Pt reports hs   Past Medical History:  Diagnosis Date  . Abscess of back   . Arthritis   . Bradycardia   . Constipation   . Dyslipidemia   . Dyspnea   . Exertional shortness of breath   . Hyperglycemia   . Hyperlipidemia   . Left sided sciatica   . Leukocytosis, unspecified   . Major depression, recurrent (HCC)   . Near syncope   . PTSD (post-traumatic stress disorder)   . Shortness of breath   . Syncope   . Vaso vagal episode     Patient Active Problem List   Diagnosis Date Noted  . Dyspnea 12/13/2014  . AAA (abdominal aortic aneurysm) without rupture (HCC) 10/21/2014  . Anosmia 10/03/2014  . Aneurysm of iliac artery (HCC) 09/26/2014  . Chronic back pain 09/08/2014  . Bradycardia 03/08/2013  . Vasovagal near-syncope 03/08/2013  . Hyperglycemia 03/08/2013  . Hypertriglyceridemia 03/08/2013  . Tobacco abuse 02/09/2013  . Dyslipidemia 02/09/2013  . Syncope 02/08/2013  . Major depressive disorder, recurrent episode (HCC) 04/03/2006  . POST TRAUMATIC STRESS DISORDER 04/03/2006  . HTN (hypertension) 04/03/2006    Past Surgical History:  Procedure Laterality Date  . CARDIAC CATHETERIZATION    . CYST EXCISION    . LEFT HEART CATHETERIZATION WITH CORONARY ANGIOGRAM N/A 02/09/2013   Procedure: LEFT HEART CATHETERIZATION  WITH CORONARY ANGIOGRAM;  Surgeon: Marykay Lex, MD;  Location: [facility] C[facility] LAB;  Service: Cardiovascular;  Laterality: N/A;  . VASECTOMY         Home Medications    Prior to Admission medications   Medication Sig Start Date End Date Taking? Authorizing Provider  amLODipine (NORVASC) 10 MG tablet Take 1 tablet (10 mg total) by mouth daily. Patient not taking: Reported on 07/15/2016 12/11/15   Elenora Gamma, MD  aspirin EC 325 MG tablet Take 325 mg by mouth daily.    [provider]  atorvastatin (LIPITOR) 40 MG tablet Take 1 tablet (40 mg total) by mouth daily at 6 PM. 12/11/15   Elenora Gamma, MD  citalopram (CELEXA) 20 MG tablet Take 1 tablet (20 mg total) by mouth daily. 12/11/15   Elenora Gamma, MD  doxycycline (VIBRAMYCIN) 100 MG capsule Take 1 capsule (100 mg total) by mouth 2 (two) times daily. 08/23/16   Elson Areas, PA-C  loratadine (CLARITIN) 10 MG tablet Take 1 tablet (10 mg total) by mouth daily. 12/14/15   Elenora Gamma, MD  metoprolol succinate (TOPROL-XL) 50 MG 24 hr tablet Take 1 tablet (50 mg total) by mouth daily. Take with or immediately following a meal. Patient not taking: Reported on 07/15/2016 12/11/15   Elenora Gamma, MD  Omega-3 Fatty Acids (FISH OIL) 1000 MG CAPS Take by mouth.  [provider]    Family History Family History  Problem Relation Age of Onset  . Heart disease Mother   . Hypertension Mother   . Hyperlipidemia Mother   . Heart attack Mother   . Mental illness Sister   . Leukemia Father   . Cancer Father   . Hyperlipidemia Father   . Hypertension Father     Social History Social History  Substance Use Topics  . Smoking status: Current Every Day Smoker    Packs/day: 1.00    Years: 30.00    Types: Cigars    Start date: 05/21/1979  . Smokeless tobacco: Never Used     Comment: uses e-sig, occasional cigar.   . Alcohol use 1.2 oz/week    2 Cans of beer per week     Comment: occ     Allergies     Poison oak extract [poison oak extract]   Review of Systems Review of Systems  Skin: Positive for rash.  All other systems reviewed and are negative.    Physical Exam Updated Vital Signs BP (!) 146/84   Pulse 64   Temp 97.7 F (36.5 C)   Resp 20   Ht 5\' 3"  (1.6 m)   Wt 68.9 kg (152 lb)   SpO2 96%   BMI 26.93 kg/m   Physical Exam  Constitutional: He appears well-developed.  Cardiovascular: Normal rate.   Pulmonary/Chest: Effort normal.  Neurological: He is alert.  Skin: Skin is warm.  Swollen red area left upper arm  12cm round area,  Dark center,  No sign of foreign body  Nursing note and vitals reviewed.    ED Treatments / Results  Labs (all labs ordered are listed, but only abnormal results are displayed) Labs Reviewed - No data to display  EKG  EKG Interpretation None       Radiology No results found.  Procedures Procedures (including critical care time)  Medications Ordered in ED Medications - No data to display   Initial Impression / Assessment and Plan / ED Course  I have reviewed the triage vital signs and the nursing notes.  Pertinent labs & imaging results that were available during my care of the patient were reviewed by me and considered in my medical decision making (see chart for details).       Final Clinical Impressions(s) / ED Diagnoses   Final diagnoses:  Skin infection    New Prescriptions Discharge Medication List as of 08/23/2016  8:50 PM    START taking these medications   Details  doxycycline (VIBRAMYCIN) 100 MG capsule Take 1 capsule (100 mg total) by mouth 2 (two) times daily., Starting Fri 08/23/2016, Print      An After Visit Summary was printed and given to the patient.    Osie CheeksSofia, Mykayla Brinton K, PA-C 08/24/16 0014    Benjiman CorePickering, Nathan, MD 08/24/16 [id number]
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
## 2016-11-12 ENCOUNTER — Ambulatory Visit: Payer: Medicare HMO | Admitting: Family Medicine

## 2016-11-18 ENCOUNTER — Ambulatory Visit: Payer: Medicare HMO | Admitting: Family Medicine

## 2016-11-26 ENCOUNTER — Ambulatory Visit: Payer: Medicare HMO | Admitting: Family Medicine

## 2016-12-06 ENCOUNTER — Other Ambulatory Visit (HOSPITAL_COMMUNITY): Payer: Medicare HMO

## 2016-12-06 ENCOUNTER — Ambulatory Visit: Payer: Medicare HMO | Admitting: Family

## 2016-12-09 ENCOUNTER — Ambulatory Visit: Payer: Medicare HMO | Admitting: Family Medicine

## 2016-12-12 ENCOUNTER — Ambulatory Visit: Payer: Medicare HMO | Admitting: Family Medicine

## 2016-12-12 ENCOUNTER — Encounter: Payer: Self-pay | Admitting: Family Medicine

## 2016-12-12 VITALS — BP 136/84 | HR 66 | Temp 96.5°F | Ht 63.0 in | Wt 154.4 lb

## 2016-12-12 DIAGNOSIS — M7021 Olecranon bursitis, right elbow: Secondary | ICD-10-CM

## 2016-12-12 DIAGNOSIS — E785 Hyperlipidemia, unspecified: Secondary | ICD-10-CM | POA: Diagnosis not present

## 2016-12-12 DIAGNOSIS — F339 Major depressive disorder, recurrent, unspecified: Secondary | ICD-10-CM

## 2016-12-12 DIAGNOSIS — I10 Essential (primary) hypertension: Secondary | ICD-10-CM

## 2016-12-12 DIAGNOSIS — R69 Illness, unspecified: Secondary | ICD-10-CM | POA: Diagnosis not present

## 2016-12-12 LAB — CMP14+EGFR
ALBUMIN: 4.5 g/dL (ref 3.6–4.8)
ALK PHOS: 99 IU/L (ref 39–117)
ALT: 19 IU/L (ref 0–44)
AST: 21 IU/L (ref 0–40)
Albumin/Globulin Ratio: 2 (ref 1.2–2.2)
BUN / CREAT RATIO: 10 (ref 10–24)
BUN: 8 mg/dL (ref 8–27)
Bilirubin Total: 0.4 mg/dL (ref 0.0–1.2)
CO2: 28 mmol/L (ref 20–29)
CREATININE: 0.77 mg/dL (ref 0.76–1.27)
Calcium: 9.5 mg/dL (ref 8.6–10.2)
Chloride: 100 mmol/L (ref 96–106)
GFR calc non Af Amer: 97 mL/min/{1.73_m2} (ref >59)
GFR, EST AFRICAN AMERICAN: 112 mL/min/{1.73_m2} (ref >59)
GLOBULIN, TOTAL: 2.3 g/dL (ref 1.5–4.5)
Glucose: 79 mg/dL (ref 65–99)
Potassium: 3.2 mmol/L — ABNORMAL LOW (ref 3.5–5.2)
SODIUM: 143 mmol/L (ref 134–144)
Total Protein: 6.8 g/dL (ref 6.0–8.5)

## 2016-12-12 LAB — CBC WITH DIFFERENTIAL/PLATELET
Basophils Absolute: 0.1 10*3/uL (ref 0.0–0.2)
Basos: 1 %
EOS (ABSOLUTE): 0.3 10*3/uL (ref 0.0–0.4)
EOS: 5 %
HEMATOCRIT: 46.5 % (ref 37.5–51.0)
Hemoglobin: 16.5 g/dL (ref 13.0–17.7)
Immature Grans (Abs): 0 10*3/uL (ref 0.0–0.1)
Immature Granulocytes: 0 %
LYMPHS ABS: 1.9 10*3/uL (ref 0.7–3.1)
Lymphs: 33 %
MCH: 33.1 pg — AB (ref 26.6–33.0)
MCHC: 35.5 g/dL (ref 31.5–35.7)
MCV: 93 fL (ref 79–97)
MONOS ABS: 0.5 10*3/uL (ref 0.1–0.9)
Monocytes: 9 %
NEUTROS ABS: 2.9 10*3/uL (ref 1.4–7.0)
Neutrophils: 52 %
Platelets: 263 10*3/uL (ref 150–379)
RBC: 4.98 x10E6/uL (ref 4.14–5.80)
RDW: 14 % (ref 12.3–15.4)
WBC: 5.6 10*3/uL (ref 3.4–10.8)

## 2016-12-12 LAB — LIPID PANEL
CHOL/HDL RATIO: 3.4 ratio (ref 0.0–5.0)
Cholesterol, Total: 108 mg/dL (ref 100–199)
HDL: 32 mg/dL — ABNORMAL LOW (ref >39)
LDL CALC: 55 mg/dL (ref 0–99)
Triglycerides: 103 mg/dL (ref 0–149)
VLDL Cholesterol Cal: 21 mg/dL (ref 5–40)

## 2016-12-12 MED ORDER — CITALOPRAM HYDROBROMIDE 20 MG PO TABS: 20.0000 mg | ORAL_TABLET | Freq: Every day | ORAL | 3 refills | Status: AC

## 2016-12-12 MED ORDER — AMLODIPINE BESYLATE 10 MG PO TABS: 10.0000 mg | ORAL_TABLET | Freq: Every day | ORAL | 3 refills | Status: AC

## 2016-12-12 MED ORDER — METOPROLOL SUCCINATE ER 50 MG PO TB24: 50.0000 mg | ORAL_TABLET | Freq: Every day | ORAL | 3 refills | Status: AC

## 2016-12-12 MED ORDER — ATORVASTATIN CALCIUM 40 MG PO TABS: 40.0000 mg | ORAL_TABLET | Freq: Every day | ORAL | 3 refills | Status: AC

## 2016-12-12 NOTE — Progress Notes (Signed)
   HPI  Patient presents today follow-up chronic medical condition as well as right elbow swelling.  Patient sent right elbow swelling for about 1 month.  There is no pain.  He does have a tingly warm feeling at times. He denies any trauma  Reports good medication compliance, however he cannot recall what medications he is taking. No headaches, chest pain, or shortness of breath. He is tolerating food and fluids like usual. He does not check his blood pressure at home.  He is occupationally active.  PMH: Smoking status noted ROS: Per HPI  Objective: BP 136/84   Pulse 66   Temp (!) 96.5 F (35.8 C) (Oral)   Ht 5' 3" (1.6 m)   Wt 154 lb 6.4 oz (70 kg)   BMI 27.35 kg/m  Gen: NAD, alert, cooperative with exam HEENT: NCAT, EOMI, PERRL CV: RRR, good S1/S2, no murmur Resp: CTABL, no wheezes, non-labored Ext: No edema, warm Neuro: Alert and oriented, No gross deficits  Assessment and plan:  #Hyperlipidemia Presumed good medication compliance, patient reports taking all the medications prescribed but cannot remember specifics. Labs today, fasting Tolerating well  #Hypertension. Doing well, continue current medications  #Olecranon bursitis Right elbow, discussed supportive care, wrapped in Ace bandage today Discussed draining if persistent or painful Usually I would refer for this.  Depression Refill Celexa, stable    Orders Placed This Encounter  Procedures  . CMP14+EGFR  . CBC with Differential/Platelet  . Lipid panel    Meds ordered this encounter  Medications  . amLODipine (NORVASC) 10 MG tablet    Sig: Take 1 tablet (10 mg total) daily by mouth.    Dispense:  90 tablet    Refill:  3    Please disregard previous 5 mg tab, increasing to 10 mg.  . metoprolol succinate (TOPROL-XL) 50 MG 24 hr tablet    Sig: Take 1 tablet (50 mg total) daily by mouth. Take with or immediately following a meal.    Dispense:  90 tablet    Refill:  3  . atorvastatin  (LIPITOR) 40 MG tablet    Sig: Take 1 tablet (40 mg total) daily at 6 PM by mouth.    Dispense:  90 tablet    Refill:  3  . citalopram (CELEXA) 20 MG tablet    Sig: Take 1 tablet (20 mg total) daily by mouth.    Dispense:  90 tablet    Refill:  3    Sam Bradshaw, MD [facility] 12/12/2016, 9:15 AM     

## 2016-12-12 NOTE — Patient Instructions (Signed)
Great to see you!  Come back in 6 months unless you need us sooner.    

## 2016-12-13 ENCOUNTER — Encounter: Payer: Self-pay | Admitting: Family Medicine

## 2016-12-18 ENCOUNTER — Encounter: Payer: Self-pay | Admitting: Family

## 2016-12-18 ENCOUNTER — Ambulatory Visit (HOSPITAL_COMMUNITY)
Admission: RE | Admit: 2016-12-18 | Discharge: 2016-12-18 | Disposition: A | Discharge status: Home / Self Care | Payer: Medicare HMO | Source: Ambulatory Visit | Attending: Family | Admitting: Family

## 2016-12-18 ENCOUNTER — Ambulatory Visit (INDEPENDENT_AMBULATORY_CARE_PROVIDER_SITE_OTHER): Payer: Medicare HMO | Admitting: Family

## 2016-12-18 VITALS — BP 130/85 | HR 58 | Temp 98.9°F | Resp 20 | Ht 63.0 in | Wt 154.6 lb

## 2016-12-18 DIAGNOSIS — I714 Abdominal aortic aneurysm, without rupture, unspecified: Secondary | ICD-10-CM

## 2016-12-18 DIAGNOSIS — I7789 Other specified disorders of arteries and arterioles: Secondary | ICD-10-CM

## 2016-12-18 DIAGNOSIS — R69 Illness, unspecified: Secondary | ICD-10-CM | POA: Diagnosis not present

## 2016-12-18 DIAGNOSIS — I779 Disorder of arteries and arterioles, unspecified: Secondary | ICD-10-CM

## 2016-12-18 DIAGNOSIS — I723 Aneurysm of iliac artery: Secondary | ICD-10-CM | POA: Insufficient documentation

## 2016-12-18 DIAGNOSIS — F17200 Nicotine dependence, unspecified, uncomplicated: Secondary | ICD-10-CM | POA: Insufficient documentation

## 2016-12-18 NOTE — Patient Instructions (Addendum)
Before your next abdominal ultrasound:  Take two Extra-Strength Gas-X capsules at bedtime the night before the test. Take another two Extra-Strength Gas-X capsules 3 hours before the test.  Avoid gas forming foods the day before the test.      Steps to Quit Smoking Smoking tobacco can be bad for your health. It can also affect almost every organ in your body. Smoking puts you and people around you at risk for many serious long-lasting (chronic) diseases. Quitting smoking is hard, but it is one of the best things that you can do for your health. It is never too late to quit. What are the benefits of quitting smoking? When you quit smoking, you lower your risk for getting serious diseases and conditions. They can include:  Lung cancer or lung disease.  Heart disease.  Stroke.  Heart attack.  Not being able to have children (infertility).  Weak bones (osteoporosis) and broken bones (fractures).  If you have coughing, wheezing, and shortness of breath, those symptoms may get better when you quit. You may also get sick less often. If you are pregnant, quitting smoking can help to lower your chances of having a baby of low birth weight. What can I do to help me quit smoking? Talk with your doctor about what can help you quit smoking. Some things you can do (strategies) include:  Quitting smoking totally, instead of slowly cutting back how much you smoke over a period of time.  Going to in-person counseling. You are more likely to quit if you go to many counseling sessions.  Using resources and support systems, such as: ? Online chats with a counselor. ? Phone quitlines. ? Printed self-help materials. ? Support groups or group counseling. ? Text messaging programs. ? Mobile phone apps or applications.  Taking medicines. Some of these medicines may have nicotine in them. If you are pregnant or breastfeeding, do not take any medicines to quit smoking unless your doctor says it is  okay. Talk with your doctor about counseling or other things that can help you.  Talk with your doctor about using more than one strategy at the same time, such as taking medicines while you are also going to in-person counseling. This can help make quitting easier. What things can I do to make it easier to quit? Quitting smoking might feel very hard at first, but there is a lot that you can do to make it easier. Take these steps:  Talk to your family and friends. Ask them to support and encourage you.  Call phone quitlines, reach out to support groups, or work with a counselor.  Ask people who smoke to not smoke around you.  Avoid places that make you want (trigger) to smoke, such as: ? Bars. ? Parties. ? Smoke-break areas at work.  Spend time with people who do not smoke.  Lower the stress in your life. Stress can make you want to smoke. Try these things to help your stress: ? Getting regular exercise. ? Deep-breathing exercises. ? Yoga. ? Meditating. ? Doing a body scan. To do this, close your eyes, focus on one area of your body at a time from head to toe, and notice which parts of your body are tense. Try to relax the muscles in those areas.  Download or buy apps on your mobile phone or tablet that can help you stick to your quit plan. There are many free apps, such as QuitGuide from the CDC (Centers for Disease Control and Prevention).   You can find more support from smokefree.gov and other websites.  This information is not intended to replace advice given to you by your health care provider. Make sure you discuss any questions you have with your health care provider. Document Released: 11/17/2008 Document Revised: 09/19/2015 Document Reviewed: 06/07/2014 Elsevier Interactive Patient Education  2018 Elsevier Inc.    Abdominal Aortic Aneurysm Blood pumps away from the heart through tubes (blood vessels) called arteries. Aneurysms are weak or damaged places in the wall of an  artery. It bulges out like a balloon. An abdominal aortic aneurysm happens in the main artery of the body (aorta). It can burst or tear, causing bleeding inside the body. This is an emergency. It needs treatment right away. What are the causes? The exact cause is unknown. Things that could cause this problem include:  Fat and other substances building up in the lining of a tube.  Swelling of the walls of a blood vessel.  Certain tissue diseases.  Belly (abdominal) trauma.  An infection in the main artery of the body.  What increases the risk? There are things that make it more likely for you to have an aneurysm. These include:  Being over the age of 62 years old.  Having high blood pressure (hypertension).  Being a male.  Being white.  Being very overweight (obese).  Having a family history of aneurysm.  Using tobacco products.  What are the signs or symptoms? Symptoms depend on the size of the aneurysm and how fast it grows. There may not be symptoms. If symptoms occur, they can include:  Pain (belly, side, lower back, or groin).  Feeling full after eating a small amount of food.  Feeling sick to your stomach (nauseous), throwing up (vomiting), or both.  Feeling a lump in your belly that feels like it is beating (pulsating).  Feeling like you will pass out (faint).  How is this treated?  Medicine to control blood pressure and pain.  Imaging tests to see if the aneurysm gets bigger.  Surgery. How is this prevented? To lessen your chance of getting this condition:  Stop smoking. Stop chewing tobacco.  Limit or avoid alcohol.  Keep your blood pressure, blood sugar, and cholesterol within normal limits.  Eat less salt.  Eat foods low in saturated fats and cholesterol. These are found in animal and whole dairy products.  Eat more fiber. Fiber is found in whole grains, vegetables, and fruits.  Keep a healthy weight.  Stay active and exercise  often.  This information is not intended to replace advice given to you by your health care provider. Make sure you discuss any questions you have with your health care provider. Document Released: 05/18/2012 Document Revised: 06/29/2015 Document Reviewed: 02/21/2012 Elsevier Interactive Patient Education  2017 Elsevier Inc.  

## 2016-12-18 NOTE — Progress Notes (Signed)
[facility] [place]   CC: Follow up Left Iliac Artery Ectasia  History of Present Illness  Frank Gill is a 61 y.o. ([birth year]) male patient whom Dr. Imogene Burnhen saw on initial evaluation on 10/21/14 for small AAA. Previous studies demonstrate an AAA, measuring 3.4 cm. 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 The patient does not have back or abdominal pain. The patient notes no history of embolic episodes from the AAA. The patient's risk factors for AAA included: age, male sex, and active smoking. On exam at that visit he had prominent popliteal pulses.   He is a vague historian, but does not seem to have claudication symptoms with walking.  The patient denies any history of stroke or TIA symptoms.  He walks about 2 hours/day in addition to yard work.   He denies any known cardiac problems.   Pt states he is unable to read very well, but his friend can read instructions to him.    Pt Diabetic: No Pt smoker: smoker  (1/3 ppd, started in 1981)    Past Medical History:  Diagnosis Date  . Abscess of back   . Arthritis   . Bradycardia   . Constipation   . Dyslipidemia   . Dyspnea   . Exertional shortness of breath   . Hyperglycemia   . Hyperlipidemia   . Left sided sciatica   . Leukocytosis, unspecified   . Major depression, recurrent (HCC)   . Near syncope   . PTSD (post-traumatic stress disorder)   . Shortness of breath   . Syncope   . Vaso vagal episode    Past Surgical History:  Procedure Laterality Date  . CARDIAC CATHETERIZATION    . CYST EXCISION    . VASECTOMY     Social History Social History   Socioeconomic History  . Marital status: Divorced    Spouse name: Not on file  . Number of children: 2  . Years of education: Not on file  . Highest education level: Not on file  Social Needs  . Financial resource strain: Not on file  . Food insecurity - worry: Not on file  . Food insecurity - inability:  Not on file  . Transportation needs - medical: Not on file  . Transportation needs - non-medical: Not on file  Occupational History  . Not on file  Tobacco Use  . Smoking status: Current Every Day Smoker    Packs/day: 1.00    Years: 30.00    Pack years: 30.00    Types: Cigars    Start date: 05/21/1979  . Smokeless tobacco: Never Used  . Tobacco comment: uses e-sig, occasional cigar.   Substance and Sexual Activity  . Alcohol use: Yes    Alcohol/week: 1.2 oz    Types: 2 Cans of beer per week    Comment: occ  . Drug use: No  . Sexual activity: Not on file  Other Topics Concern  . Not on file  Social History Narrative  . Not on file   Family History Family History  Problem Relation Age of Onset  . Heart disease Mother   . Hypertension Mother   . Hyperlipidemia Mother   . Heart attack Mother   . Mental illness Sister   . Leukemia Father   . Cancer Father   . Hyperlipidemia Father   . Hypertension Father     Current Outpatient Medications on File Prior to  Visit  Medication Sig Dispense Refill  . amLODipine (NORVASC) 10 MG tablet Take 1 tablet (10 mg total) daily by mouth. 90 tablet 3  . aspirin EC 325 MG tablet Take 325 mg by mouth daily.    Marland Kitchen. atorvastatin (LIPITOR) 40 MG tablet Take 1 tablet (40 mg total) daily at 6 PM by mouth. 90 tablet 3  . citalopram (CELEXA) 20 MG tablet Take 1 tablet (20 mg total) daily by mouth. 90 tablet 3  . doxycycline (VIBRAMYCIN) 100 MG capsule Take 1 capsule (100 mg total) by mouth 2 (two) times daily. 20 capsule 0  . loratadine (CLARITIN) 10 MG tablet Take 1 tablet (10 mg total) by mouth daily. 90 tablet 3  . metoprolol succinate (TOPROL-XL) 50 MG 24 hr tablet Take 1 tablet (50 mg total) daily by mouth. Take with or immediately following a meal. 90 tablet 3  . Omega-3 Fatty Acids (FISH OIL) 1000 MG CAPS Take by mouth.     No current facility-administered medications on file prior to visit.    Allergies  Allergen Reactions  . Poison Oak  Extract [Poison Oak Extract]     ROS: See HPI for pertinent positives and negatives.  Physical Examination  Vitals:   12/18/16 0905  BP: 130/85  Pulse: (!) 58  Resp: 20  Temp: 98.9 F (37.2 C)  TempSrc: Oral  SpO2: 99%  Weight: 154 lb 9.6 oz (70.1 kg)  Height: 5\' 3"  (1.6 m)   Body mass index is 27.39 kg/m.  General:A&O x 3, WN male  Head: [place]/AT  Eyes: PERRLA  Pulmonary: Sym exp, respirations are non labored, good air movt, CTAB, no rales, rhonchi, or wheezing  Cardiac: Regular rhythm, bradycardic (on a beta blocker), no detected murmur  Vascular: Vessel Right Left  Radial Not Palpable Palpable  Brachial Palpable Palpable  Carotid Palpable, without bruit Palpable, without bruit  Aorta Not palpable N/A  Femoral Not Palpable Palpable  Popliteal Prominently palpable Prominently palpable  PT Palpable Palpable  DP Palpable Palpable   Gastrointestinal: soft, NTND, no G/R, no HSM, no palpable masses, no CVAT B  Musculoskeletal: M/S 5/5 throughout, Extremities without ischemic changes   Neurologic: CN 2-12 intact , Pain and light touch intact in extremities , Motor exam as listed above  Psychiatric: Judgment intact, Mood & affect appropriate for pt's clinical situation  Dermatologic: See M/S exam for extremity exam, no rashes otherwise noted. Several thick mycotic toenails.      DATA  AAA Duplex (12/18/2016):  Previous size: 2.8 cm (Date: 11-29-15); Right CIA: 1.6 cm; Left CIA: 1.6 cm, decreased visualization  Current size:  2.3 cm (Date: 12-18-16); Right CIA: 1.4 cm; Left CIA: 1.8 cm. No evidence of aneurysmal dilatation of the aorta and right common iliac artery. Slight dilatation of the left common iliac artery. The measurement of 3.4 cm in the proximal abdominal aorta [facility] on 09-22-14 appears over estimated. Measurements obtained on 11-29-15 also appear slightly overestimated.   Popliteal artery duplex (11-29-15): No  aneurysmal dilatation of the bilateral popliteal arteries.   Medical Decision Making  The patient is a 61 y.o. male who presents with asymptomatic left common iliac artery ectasia with no increase in size. No evidence of abdominal aortic aneurysm, normal diameter of right common iliac artery.   The patient was counseled re smoking cessation and given several free resources re smoking cessation.   Based on this patient's exam and diagnostic studies, the patient will follow up in 1 year  with  the following studies: AAA duplex.  Consideration for repair of AAA would be made when the size is 5.5 cm, growth > 1 cm/yr, and symptomatic status.       Consideration for repair of common iliac artery aneurysm would be made when the size is >3.5 cm.         The patient was given information about AAA including signs, symptoms, treatment, and how to minimize the risk of enlargement and rupture of aneurysms.    I emphasized the importance of maximal medical management including strict control of blood pressure, blood glucose, and lipid levels, antiplatelet agents, obtaining regular exercise, and  cessation of smoking.   The patient was advised to call 911 should the patient experience sudden onset abdominal or back pain.   Thank you for allowing [place] to participate in this patient's care.  Charisse March, RN, MSN, FNP-C [facility] [place] Office: [phone]  Clinic Physician: Edilia Bo  12/18/2016, 9:45 AM

## 2016-12-19 NOTE — Addendum Note (Signed)
Addended by: Burton ApleyPETTY, Dayvon Dax A on: 12/19/2016 03:50 PM   Modules accepted: Orders

## 2016-12-20 ENCOUNTER — Other Ambulatory Visit: Payer: Self-pay | Admitting: Family Medicine

## 2017-02-11 ENCOUNTER — Other Ambulatory Visit: Payer: Self-pay | Admitting: *Deleted

## 2017-02-11 MED ORDER — ALBUTEROL SULFATE HFA 108 (90 BASE) MCG/ACT IN AERS: 2.0000 | INHALATION_SPRAY | Freq: Four times a day (QID) | RESPIRATORY_TRACT | 2 refills | Status: AC | PRN

## 2017-02-11 NOTE — Telephone Encounter (Signed)
Fax RF received Refill ProAir #9 1-2 puffs q 6 hrs prn SOB Not on current med list, last filled [facility]11/15 [facility]

## 2017-02-11 NOTE — Telephone Encounter (Signed)
Pt notified of rx. 

## 2017-02-11 NOTE — Addendum Note (Signed)
Addended by: Elenora GammaBRADSHAW, Evaleen Sant L on: 02/11/2017 04:46 PM   Modules accepted: Orders

## 2017-02-11 NOTE — Telephone Encounter (Signed)
Albuterol sent  Murtis SinkSam Brazen Domangue, MD [facility] 02/11/2017, 4:45 PM

## 2017-03-11 IMAGING — US US AORTA
1 series · 14 of 24 positions shown · non-contrast
Comparison: None.

CLINICAL DATA: Tobacco abuse.

EXAM:
ULTRASOUND OF ABDOMINAL AORTA
TECHNIQUE: Ultrasound examination of the abdominal aorta was performed to
evaluate for abdominal aortic aneurysm.

[Series 1: us aorta · 0.30mm/px · 14 of 24 slices shown]
[im 1/24]
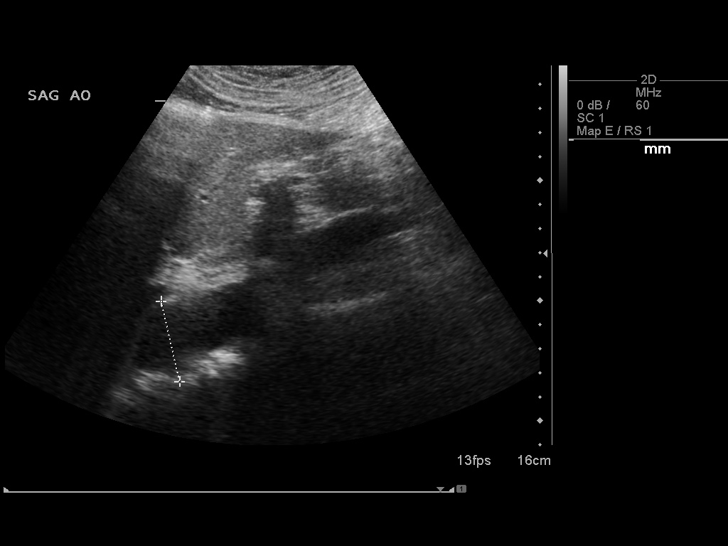
[im 3/24]
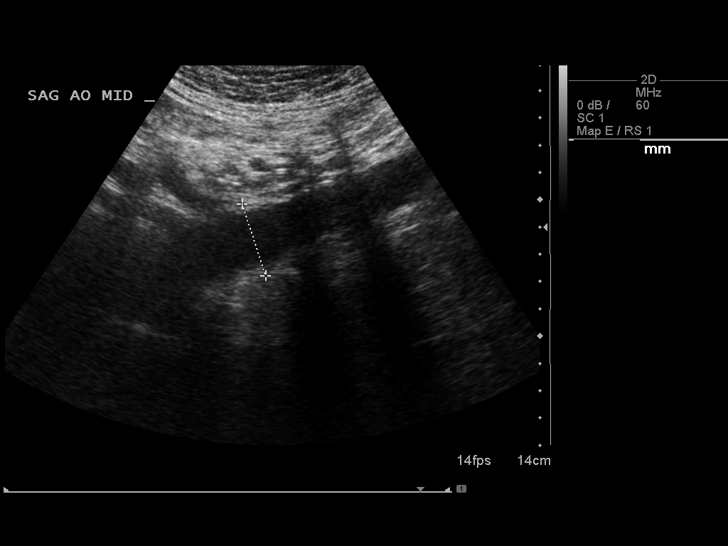
[im 5/24]
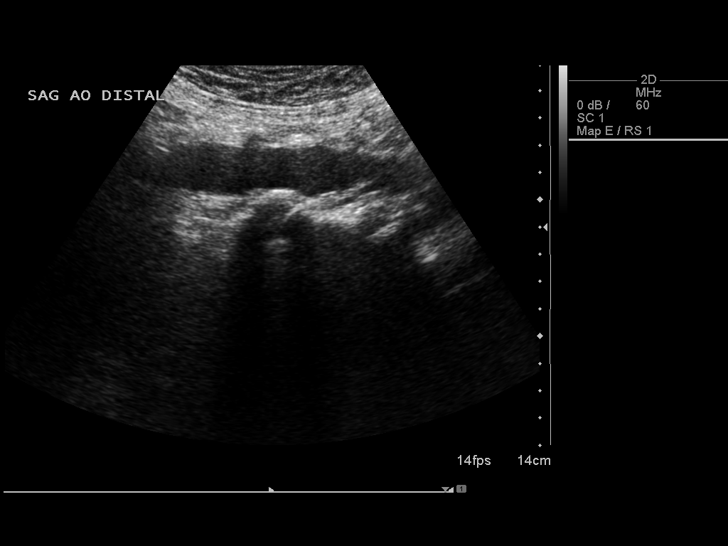
[im 7/24]
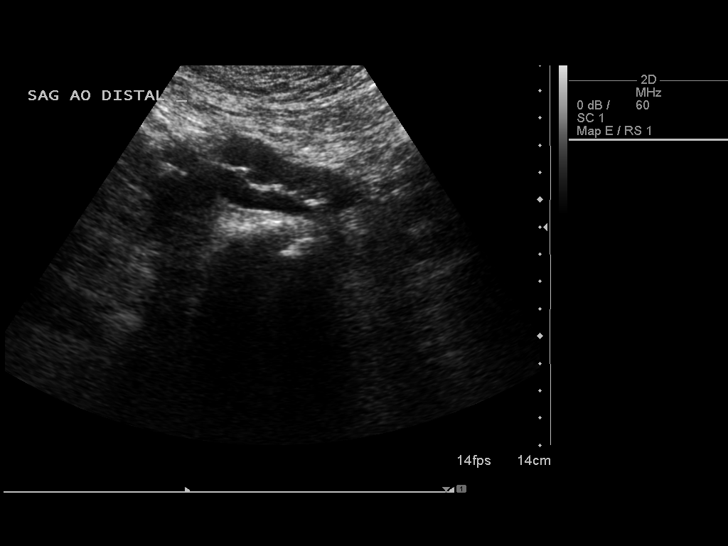
[im 8/24]
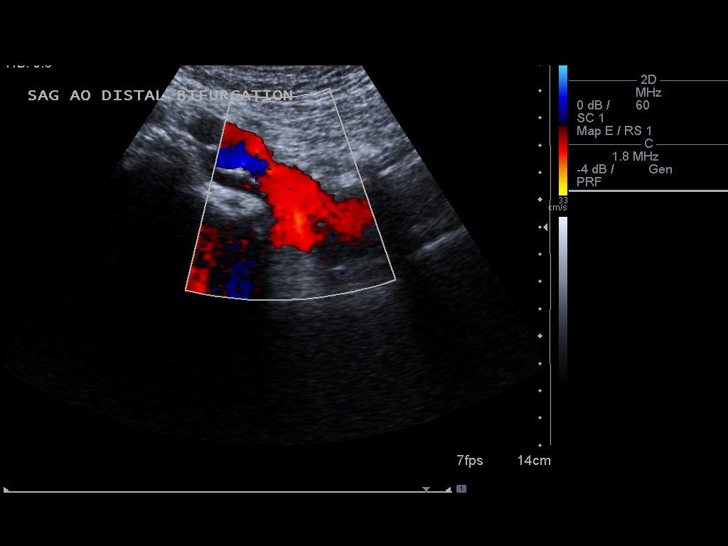
[im 10/24]
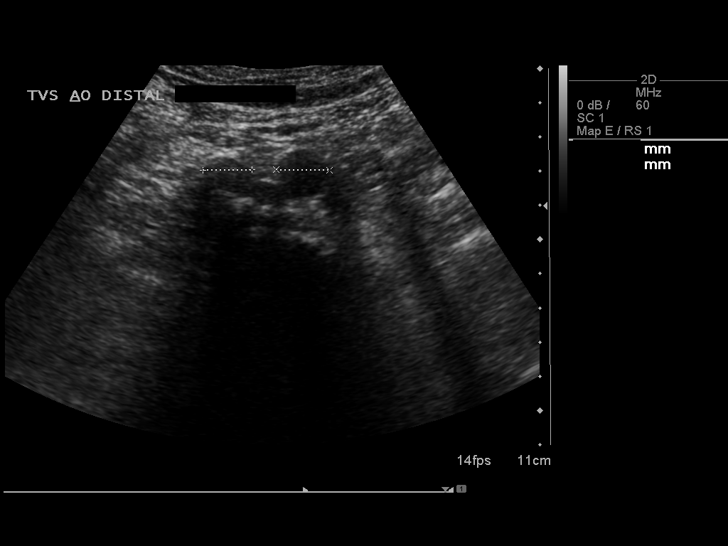
[im 12/24]
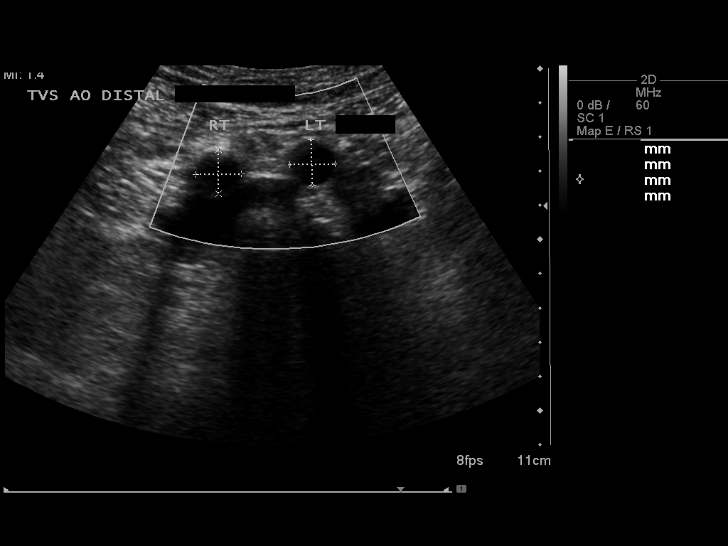
[im 13/24]
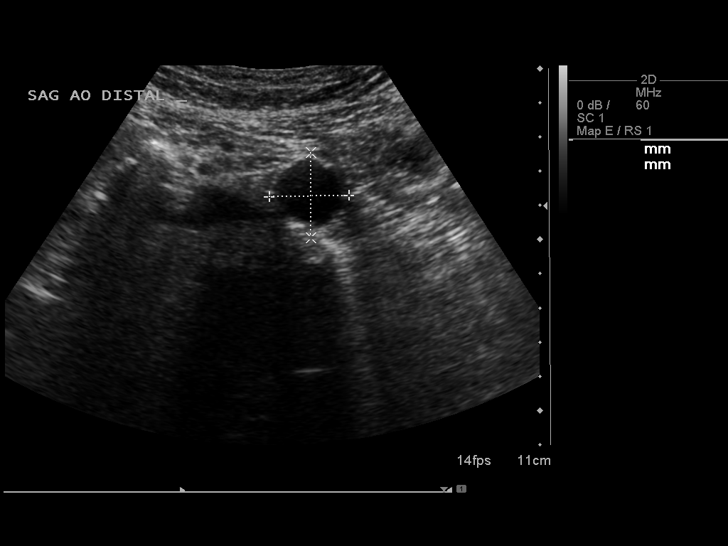
[im 15/24]
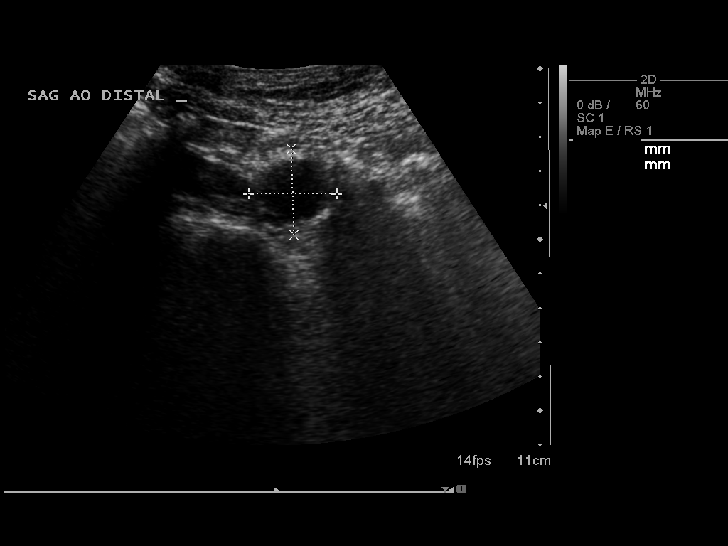
[im 17/24]
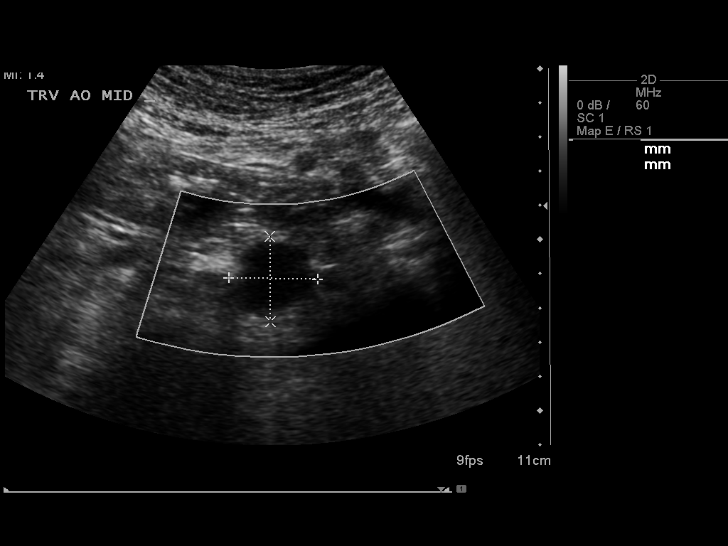
[im 19/24]
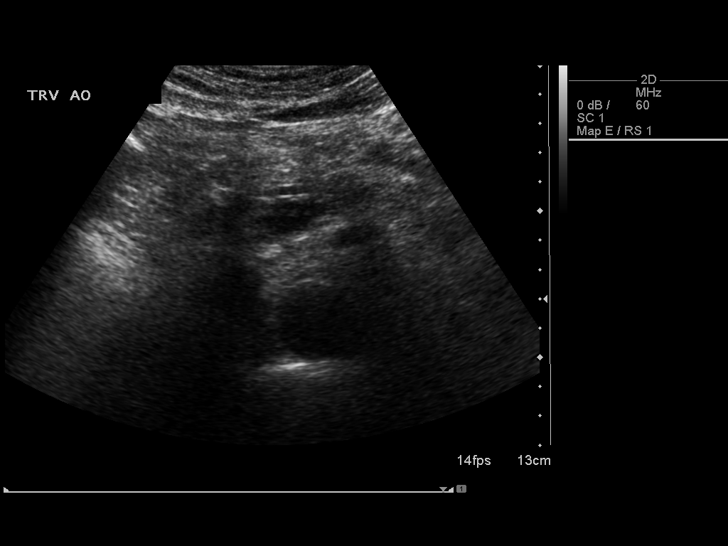
[im 20/24]
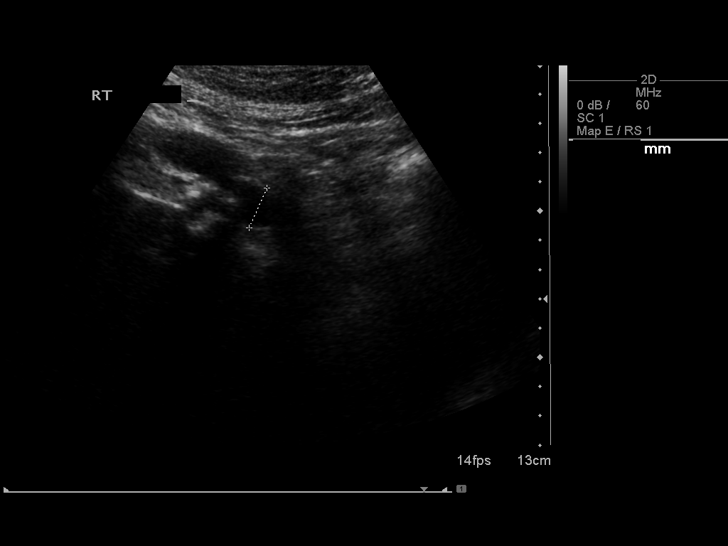
[im 22/24]
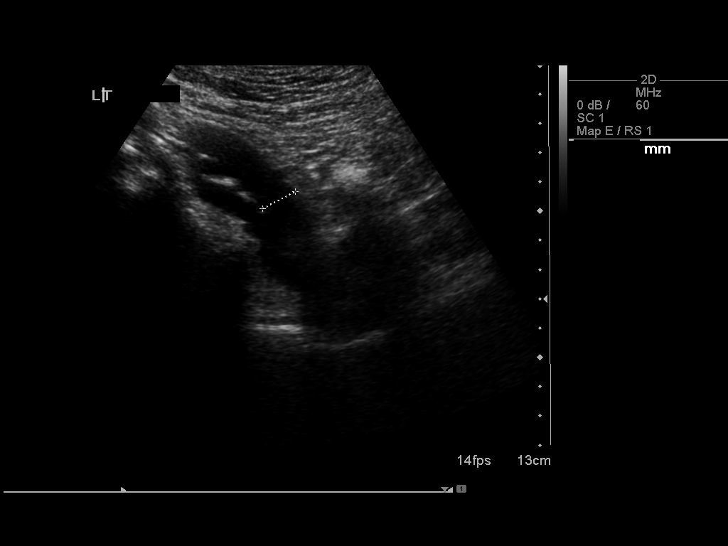
[im 24/24]
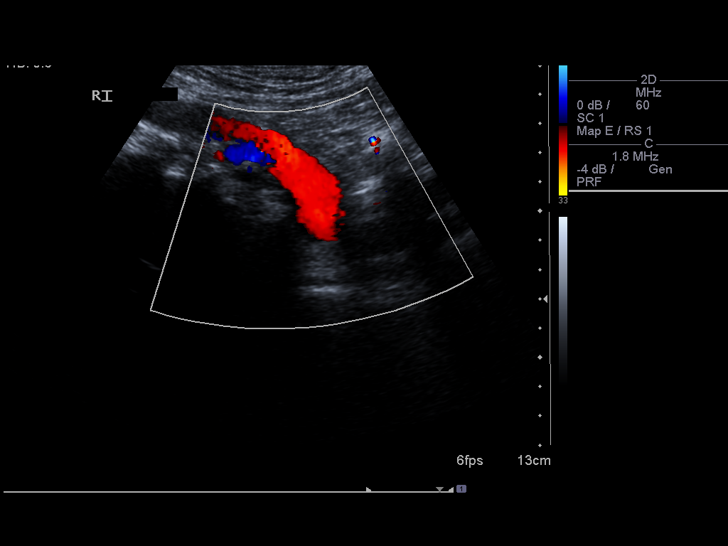

[14 of 24 positions shown; findings below may reference images not displayed]

FINDINGS: Abdominal Aorta

3.4 x 2.9 cm upper abdominal aortic aneurysm. Distal abdominal aorta
has a maximum diameter 2.9 cm

Maximum AP

Diameter:  3.4 cm

Maximum TRV

Diameter: 2.9 cm

Aneurysmal dilatation of the right common iliac artery to 1.5 cm
noted. Ectasia of the left common iliac artery 1.3 cm noted.
IMPRESSION: 1. 3.4 cm upper abdominal aortic aneurysm. Distal aorta has a
maximum diameter of 2.9 cm. Recommend followup by US in 3 years.
This recommendation follows ACR consensus guidelines: White Paper of
the ACR Incidental Findings Committee II on Vascular Findings. [HOSPITAL] 8745; [DATE]

2. 1.5 cm aneurysmal dilatation of the right common iliac artery.
1.3 cm ectasia of the left common iliac artery. Vascular surgery
consultation suggested.

## 2017-03-13 ENCOUNTER — Ambulatory Visit: Payer: Medicare HMO | Admitting: Family Medicine

## 2017-03-25 ENCOUNTER — Encounter: Payer: Self-pay | Admitting: Family Medicine

## 2017-06-12 ENCOUNTER — Ambulatory Visit: Payer: Medicare HMO | Admitting: Family Medicine

## 2017-12-11 DIAGNOSIS — M25774 Osteophyte, right foot: Secondary | ICD-10-CM | POA: Diagnosis not present

## 2017-12-11 DIAGNOSIS — L600 Ingrowing nail: Secondary | ICD-10-CM | POA: Diagnosis not present

## 2018-01-13 ENCOUNTER — Ambulatory Visit: Payer: Medicare HMO | Admitting: Family Medicine

## 2018-07-08 ENCOUNTER — Ambulatory Visit (INDEPENDENT_AMBULATORY_CARE_PROVIDER_SITE_OTHER): Payer: Medicare HMO | Admitting: Internal Medicine

## 2018-07-08 ENCOUNTER — Other Ambulatory Visit: Payer: Self-pay

## 2018-07-08 ENCOUNTER — Encounter: Payer: Self-pay | Admitting: Internal Medicine

## 2018-07-08 ENCOUNTER — Encounter: Payer: Self-pay | Admitting: Gastroenterology

## 2018-07-08 VITALS — BP 140/90 | HR 58 | Temp 97.8°F | Wt 148.0 lb

## 2018-07-08 DIAGNOSIS — M545 Low back pain, unspecified: Secondary | ICD-10-CM

## 2018-07-08 DIAGNOSIS — E032 Hypothyroidism due to medicaments and other exogenous substances: Secondary | ICD-10-CM

## 2018-07-08 DIAGNOSIS — I10 Essential (primary) hypertension: Secondary | ICD-10-CM

## 2018-07-08 DIAGNOSIS — E785 Hyperlipidemia, unspecified: Secondary | ICD-10-CM

## 2018-07-08 DIAGNOSIS — Z1211 Encounter for screening for malignant neoplasm of colon: Secondary | ICD-10-CM | POA: Diagnosis not present

## 2018-07-08 DIAGNOSIS — I714 Abdominal aortic aneurysm, without rupture, unspecified: Secondary | ICD-10-CM

## 2018-07-08 DIAGNOSIS — G8929 Other chronic pain: Secondary | ICD-10-CM | POA: Diagnosis not present

## 2018-07-08 DIAGNOSIS — Z720 Tobacco use: Secondary | ICD-10-CM | POA: Diagnosis not present

## 2018-07-08 LAB — COMPREHENSIVE METABOLIC PANEL
ALT: 20 U/L (ref 0–53)
AST: 33 U/L (ref 0–37)
Albumin: 4.3 g/dL (ref 3.5–5.2)
Alkaline Phosphatase: 66 U/L (ref 39–117)
BUN: 12 mg/dL (ref 6–23)
CO2: 32 mEq/L (ref 19–32)
Calcium: 9.7 mg/dL (ref 8.4–10.5)
Chloride: 103 mEq/L (ref 96–112)
Creatinine, Ser: 0.73 mg/dL (ref 0.40–1.50)
GFR: 108.13 mL/min (ref >60.00)
Glucose, Bld: 93 mg/dL (ref 70–99)
Potassium: 4.6 mEq/L (ref 3.5–5.1)
Sodium: 140 mEq/L (ref 135–145)
Total Bilirubin: 0.7 mg/dL (ref 0.2–1.2)
Total Protein: 6.6 g/dL (ref 6.0–8.3)

## 2018-07-08 LAB — CBC WITH DIFFERENTIAL/PLATELET
Basophils Absolute: 0.1 10*3/uL (ref 0.0–0.1)
Basophils Relative: 2.2 % (ref 0.0–3.0)
Eosinophils Absolute: 0.2 10*3/uL (ref 0.0–0.7)
Eosinophils Relative: 3.3 % (ref 0.0–5.0)
HCT: 48.7 % (ref 39.0–52.0)
Hemoglobin: 16.6 g/dL (ref 13.0–17.0)
Lymphocytes Relative: 28.9 % (ref 12.0–46.0)
Lymphs Abs: 1.6 10*3/uL (ref 0.7–4.0)
MCHC: 34.1 g/dL (ref 30.0–36.0)
MCV: 94.5 fl (ref 78.0–100.0)
Monocytes Absolute: 0.4 10*3/uL (ref 0.1–1.0)
Monocytes Relative: 6.8 % (ref 3.0–12.0)
Neutro Abs: 3.3 10*3/uL (ref 1.4–7.7)
Neutrophils Relative %: 58.8 % (ref 43.0–77.0)
Platelets: 229 10*3/uL (ref 150.0–400.0)
RBC: 5.15 Mil/uL (ref 4.22–5.81)
RDW: 13.9 % (ref 11.5–15.5)
WBC: 5.6 10*3/uL (ref 4.0–10.5)

## 2018-07-08 LAB — LIPID PANEL
Cholesterol: 159 mg/dL (ref 0–200)
HDL: 37.3 mg/dL — ABNORMAL LOW (ref >39.00)
LDL Cholesterol: 98 mg/dL (ref 0–99)
NonHDL: 121.89
Total CHOL/HDL Ratio: 4
Triglycerides: 117 mg/dL (ref 0.0–149.0)
VLDL: 23.4 mg/dL (ref 0.0–40.0)

## 2018-07-08 LAB — HEMOGLOBIN A1C: Hgb A1c MFr Bld: 5.9 % (ref 4.6–6.5)

## 2018-07-08 LAB — VITAMIN B12: Vitamin B-12: 116 pg/mL — ABNORMAL LOW (ref 211–911)

## 2018-07-08 LAB — VITAMIN D 25 HYDROXY (VIT D DEFICIENCY, FRACTURES): VITD: 40.31 ng/mL (ref 30.00–100.00)

## 2018-07-08 LAB — TSH: TSH: 0.26 u[IU]/mL — ABNORMAL LOW (ref 0.35–4.50)

## 2018-07-08 MED ORDER — HYDROCHLOROTHIAZIDE 25 MG PO TABS: 25.0000 mg | ORAL_TABLET | Freq: Every day | ORAL | 1 refills | Status: AC

## 2018-07-08 NOTE — Progress Notes (Addendum)
New Patient Office Visit     CC/Reason for Visit: Establish care, medication refills, follow-up on chronic medical conditions Previous PCP: Kevin Fenton with [facility] Last Visit: 2018  HPI: Frank Gill (goes by Frank Gill) is a 63 y.o. male who is coming in today for the above mentioned reasons.  He is due for annual physical/Medicare wellness.  He is here with his sister today who is his healthcare power of attorney, he has some type of mental dis-capacity and is on disability.  Past Medical History is significant for: Hypertension and hyperlipidemia for which he has not been taking medications for months.  Review of records also shows a history of an abdominal aortic aneurysm diagnosed by screening ultrasound in 2016 at which time there was a 3.5 cm upper abdominal aortic aneurysm and a distal aortic aneurysm of 2.9 cm.  He is a smoker of a pack a day for years since about age 89, does not drink alcohol, family history significant for a mother with coronary artery disease a maternal grandmother with diabetes and a paternal aunt with colon cancer.  He complains of occasional mild back pain and right shoulder pain due to his scoliosis.   Past Medical/Surgical History: Past Medical History:  Diagnosis Date  . Abscess of back   . Arthritis   . Bradycardia   . Constipation   . Dyslipidemia   . Dyspnea   . Exertional shortness of breath   . Hyperglycemia   . Hyperlipidemia   . Left sided sciatica   . Leukocytosis, unspecified   . Major depression, recurrent (HCC)   . Near syncope   . PTSD (post-traumatic stress disorder)   . Shortness of breath   . Syncope   . Vaso vagal episode     Past Surgical History:  Procedure Laterality Date  . CARDIAC CATHETERIZATION    . CYST EXCISION    . LEFT HEART CATHETERIZATION WITH CORONARY ANGIOGRAM N/A 02/09/2013   Procedure: LEFT HEART CATHETERIZATION WITH CORONARY ANGIOGRAM;  Surgeon: Marykay Lex, MD;   [facility]: [facility] CATH LAB;  Service: Cardiovascular;  Laterality: N/A;  . VASECTOMY      Social History:  reports that he has been smoking cigars. He started smoking about 39 years ago. He has a 30.00 pack-year smoking history. He has never used smokeless tobacco. He reports current alcohol use of about 2.0 standard drinks of alcohol per week. He reports that he does not use drugs.  Allergies: Allergies  Allergen Reactions  . Poison Oak Extract [Poison Oak Extract]     Family History:  Family History  Problem Relation Age of Onset  . Heart disease Mother   . Hypertension Mother   . Hyperlipidemia Mother   . Heart attack Mother   . Mental illness Sister   . Leukemia Father   . Cancer Father   . Hyperlipidemia Father   . Hypertension Father      Current Outpatient Medications:  .  albuterol (PROVENTIL HFA;VENTOLIN HFA) 108 (90 Base) MCG/ACT inhaler, Inhale 2 puffs into the lungs every 6 (six) hours as needed for wheezing or shortness of breath. (Patient not taking: Reported on 07/08/2018), Disp: 1 Inhaler, Rfl: 2 .  amLODipine (NORVASC) 10 MG tablet, Take 1 tablet (10 mg total) daily by mouth. (Patient not taking: Reported on 07/08/2018), Disp: 90 tablet, Rfl: 3 .  aspirin EC 325 MG tablet, Take 325 mg by mouth daily., Disp: , Rfl:  .  atorvastatin (LIPITOR) 40 MG  tablet, Take 1 tablet (40 mg total) daily at 6 PM by mouth. (Patient not taking: Reported on 07/08/2018), Disp: 90 tablet, Rfl: 3 .  citalopram (CELEXA) 20 MG tablet, Take 1 tablet (20 mg total) daily by mouth. (Patient not taking: Reported on 07/08/2018), Disp: 90 tablet, Rfl: 3 .  doxycycline (VIBRAMYCIN) 100 MG capsule, Take 1 capsule (100 mg total) by mouth 2 (two) times daily. (Patient not taking: Reported on 07/08/2018), Disp: 20 capsule, Rfl: 0 .  EQ ALLERGY RELIEF 10 MG tablet, TAKE 1 TABLET BY MOUTH ONCE DAILY (Patient not taking: Reported on 07/08/2018), Disp: 90 tablet, Rfl: 1 .  hydrochlorothiazide (HYDRODIURIL) 25 MG tablet,  Take 1 tablet (25 mg total) by mouth daily., Disp: 90 tablet, Rfl: 1 .  metoprolol succinate (TOPROL-XL) 50 MG 24 hr tablet, Take 1 tablet (50 mg total) daily by mouth. Take with or immediately following a meal. (Patient not taking: Reported on 07/08/2018), Disp: 90 tablet, Rfl: 3 .  Omega-3 Fatty Acids (FISH OIL) 1000 MG CAPS, Take by mouth., Disp: , Rfl:   Review of Systems:  Constitutional: Denies fever, chills, diaphoresis, appetite change and fatigue.  HEENT: Denies photophobia, eye pain, redness, hearing loss, ear pain, congestion, sore throat, rhinorrhea, sneezing, mouth sores, trouble swallowing, neck pain, neck stiffness and tinnitus.   Respiratory: Denies SOB, DOE, cough, chest tightness,  and wheezing.   Cardiovascular: Denies chest pain, palpitations and leg swelling.  Gastrointestinal: Denies nausea, vomiting, abdominal pain, diarrhea, constipation, blood in stool and abdominal distention.  Genitourinary: Denies dysuria, urgency, frequency, hematuria, flank pain and difficulty urinating.  Endocrine: Denies: hot or cold intolerance, sweats, changes in hair or nails, polyuria, polydipsia. Musculoskeletal: Denies myalgias, back pain, joint swelling, arthralgias and gait problem.  Skin: Denies pallor, rash and wound.  Neurological: Denies dizziness, seizures, syncope, weakness, light-headedness, numbness and headaches.  Hematological: Denies adenopathy. Easy bruising, personal or family bleeding history  Psychiatric/Behavioral: Denies suicidal ideation, mood changes, confusion, nervousness, sleep disturbance and agitation    Physical Exam: Vitals:   07/08/18 1302  BP: 140/90  Pulse: (!) 58  Temp: 97.8 F (36.6 C)  TempSrc: Oral  SpO2: 94%  Weight: 148 lb (67.1 kg)   Body mass index is 26.22 kg/m.  Constitutional: NAD, calm, comfortable Eyes: PERRL, lids and conjunctivae normal ENMT: Mucous membranes are moist.  Respiratory: clear to auscultation bilaterally, no wheezing,  no crackles. Normal respiratory effort. No accessory muscle use.  Cardiovascular: Regular rate and rhythm, no murmurs / rubs / gallops. No extremity edema. 2+ pedal pulses. No carotid bruits.  Abdomen: no tenderness, no masses palpated. No hepatosplenomegaly. Bowel sounds positive.  Musculoskeletal: no clubbing / cyanosis. No joint deformity upper and lower extremities. Good ROM, no contractures. Normal muscle tone.  Skin: no rashes, lesions, ulcers. No induration Psychiatric: Normal judgment and insight. Alert and oriented x 3. Normal mood.    Impression and Plan:  Screening for colon cancer  - Plan: Ambulatory referral to Gastroenterology  Essential hypertension  -Start hydrochlorothiazide 25 mg daily, follow-up in 4 to 6 weeks.  Tobacco abuse -Have not had time to address today, will address at subsequent visits.  Dyslipidemia - -check lipids today, then decide on restarting statin.  Last measured LDL was 55 in November 2018.  Chronic bilateral low back pain without sciatica -Have advised PRN ibuprofen.  AAA (abdominal aortic aneurysm) without rupture (HCC)  -3.4 cm in 2016, no follow-up since. -Will repeat abdominal ultrasound today.     Patient Instructions  -Nice meeting  you today!!  -Lab work today; will notify you when results are available.  -Start taking HCTZ 25 mg daily.  -Schedule follow up in 4-6 weeks.     Chaya Jan, MD [facility]

## 2018-07-08 NOTE — Patient Instructions (Signed)
-  Nice meeting you today!!  -Lab work today; will notify you when results are available.  -Start taking HCTZ 25 mg daily.  -Schedule follow up in 4-6 weeks.

## 2018-07-09 ENCOUNTER — Other Ambulatory Visit (INDEPENDENT_AMBULATORY_CARE_PROVIDER_SITE_OTHER): Payer: Medicare HMO

## 2018-07-09 ENCOUNTER — Encounter: Payer: Self-pay | Admitting: Internal Medicine

## 2018-07-09 ENCOUNTER — Other Ambulatory Visit: Payer: Self-pay | Admitting: Internal Medicine

## 2018-07-09 DIAGNOSIS — R7989 Other specified abnormal findings of blood chemistry: Secondary | ICD-10-CM | POA: Diagnosis not present

## 2018-07-09 DIAGNOSIS — R7302 Impaired glucose tolerance (oral): Secondary | ICD-10-CM | POA: Insufficient documentation

## 2018-07-09 LAB — T3, FREE: T3, Free: 3.2 pg/mL (ref 2.3–4.2)

## 2018-07-09 LAB — T4, FREE: Free T4: 0.82 ng/dL (ref 0.60–1.60)

## 2018-07-09 NOTE — Addendum Note (Signed)
Addended by: Conrad Trevose on: 07/09/2018 11:48 AM   Modules accepted: Orders

## 2018-07-10 ENCOUNTER — Encounter: Payer: Self-pay | Admitting: Internal Medicine

## 2018-07-10 DIAGNOSIS — E0590 Thyrotoxicosis, unspecified without thyrotoxic crisis or storm: Secondary | ICD-10-CM | POA: Insufficient documentation

## 2018-07-13 ENCOUNTER — Ambulatory Visit: Payer: Medicare HMO

## 2018-07-13 ENCOUNTER — Other Ambulatory Visit: Payer: Self-pay

## 2018-07-23 ENCOUNTER — Other Ambulatory Visit: Payer: Self-pay | Admitting: Internal Medicine

## 2018-07-23 DIAGNOSIS — R7989 Other specified abnormal findings of blood chemistry: Secondary | ICD-10-CM

## 2018-08-19 ENCOUNTER — Encounter: Payer: Medicare HMO | Admitting: Gastroenterology

## 2018-10-26 ENCOUNTER — Other Ambulatory Visit: Payer: Self-pay

## 2020-12-20 ENCOUNTER — Telehealth: Payer: Self-pay

## 2020-12-20 NOTE — Telephone Encounter (Signed)
Last OV 07/08/18.  LVM instructions for pt to return call to schedule appt with PCP.

## 2020-12-26 NOTE — Telephone Encounter (Signed)
LVM instructions for pt to call back to schedule CPE with PCP.

## 2023-08-27 ENCOUNTER — Encounter (HOSPITAL_COMMUNITY): Payer: Self-pay | Admitting: Emergency Medicine

## 2023-08-27 ENCOUNTER — Other Ambulatory Visit: Payer: Self-pay

## 2023-08-27 ENCOUNTER — Emergency Department (HOSPITAL_COMMUNITY)
Admission: EM | Admit: 2023-08-27 | Discharge: 2023-08-27 | Disposition: A | Discharge status: Home / Self Care | Attending: Emergency Medicine | Admitting: Emergency Medicine

## 2023-08-27 ENCOUNTER — Emergency Department (HOSPITAL_COMMUNITY)

## 2023-08-27 DIAGNOSIS — M19071 Primary osteoarthritis, right ankle and foot: Secondary | ICD-10-CM | POA: Insufficient documentation

## 2023-08-27 DIAGNOSIS — H9190 Unspecified hearing loss, unspecified ear: Secondary | ICD-10-CM | POA: Diagnosis not present

## 2023-08-27 DIAGNOSIS — Z7982 Long term (current) use of aspirin: Secondary | ICD-10-CM | POA: Insufficient documentation

## 2023-08-27 DIAGNOSIS — M7989 Other specified soft tissue disorders: Secondary | ICD-10-CM | POA: Diagnosis present

## 2023-08-27 NOTE — ED Provider Notes (Signed)
 [facility] AT [facility] Provider Note   CSN: [id number] Arrival date & time: 08/27/23  1131     Patient presents with: Foot Pain and Hearing Problem   Frank Gill is a 68 y.o. male with a prior history including hyperlipidemia, arthritis, PTSD and bradycardia, not currently in connection with a primary provider presents for evaluation of right great toe swelling and stiffness.  He also has significant loss of hearing which has been chronic for greater than 1 year but has been worsening.  He presents with a friend at the bedside who advocates for him as patient is very difficult to communicate with given his significant hearing loss.  Attempts to communicate via written word are also difficult as patient states he does not read well.  He denies pain or injury to his right foot or toe, but endorses increasing swelling at the base of his right great toe.  He lives here locally but walks everywhere as he does not drive.  He has no ear pain, denies headaches, dizziness.   The history is provided by the patient.       Prior to Admission medications   Medication Sig Start Date End Date Taking? Authorizing Provider  albuterol  (PROVENTIL  HFA;VENTOLIN  HFA) 108 (90 Base) MCG/ACT inhaler Inhale 2 puffs into the lungs every 6 (six) hours as needed for wheezing or shortness of breath. Patient not taking: Reported on 07/08/2018 02/11/17   Harl Jayson CROME, MD  amLODipine  (NORVASC ) 10 MG tablet Take 1 tablet (10 mg total) daily by mouth. Patient not taking: Reported on 07/08/2018 12/12/16   Harl Jayson CROME, MD  aspirin  EC 325 MG tablet Take 325 mg by mouth daily.    [provider]  atorvastatin  (LIPITOR) 40 MG tablet Take 1 tablet (40 mg total) daily at 6 PM by mouth. Patient not taking: Reported on 07/08/2018 12/12/16   Harl Jayson CROME, MD  citalopram  (CELEXA ) 20 MG tablet Take 1 tablet (20 mg total) daily by mouth. Patient not taking: Reported on 07/08/2018  12/12/16   Harl Jayson CROME, MD  doxycycline  (VIBRAMYCIN ) 100 MG capsule Take 1 capsule (100 mg total) by mouth 2 (two) times daily. Patient not taking: Reported on 07/08/2018 08/23/16   Sofia, Leslie K, PA-C  EQ ALLERGY RELIEF 10 MG tablet TAKE 1 TABLET BY MOUTH ONCE DAILY Patient not taking: Reported on 07/08/2018 12/20/16   Harl Jayson CROME, MD  hydrochlorothiazide  (HYDRODIURIL ) 25 MG tablet Take 1 tablet (25 mg total) by mouth daily. 07/08/18   Theophilus Andrews, Tully GRADE, MD  metoprolol  succinate (TOPROL -XL) 50 MG 24 hr tablet Take 1 tablet (50 mg total) daily by mouth. Take with or immediately following a meal. Patient not taking: Reported on 07/08/2018 12/12/16   Harl Jayson CROME, MD  Omega-3 Fatty Acids (FISH OIL) 1000 MG CAPS Take by mouth.    [provider]    Allergies: Poison oak extract [poison oak extract]    Review of Systems  Constitutional:  Negative for fever.  HENT:  Positive for hearing loss. Negative for congestion, ear discharge, sore throat and tinnitus.   Eyes: Negative.   Respiratory:  Negative for chest tightness and shortness of breath.   Cardiovascular:  Negative for chest pain.  Gastrointestinal:  Negative for abdominal pain and nausea.  Genitourinary: Negative.   Musculoskeletal:  Positive for arthralgias and joint swelling. Negative for neck pain.  Skin: Negative.  Negative for rash and wound.  Neurological:  Negative for dizziness, weakness,  light-headedness, numbness and headaches.  Psychiatric/Behavioral: Negative.      Updated Vital Signs BP (!) 154/94   Pulse (!) 44   Temp 97.7 F (36.5 C)   Resp 16   Ht 5' 3 (1.6 m)   Wt 67.1 kg   SpO2 96%   BMI 26.22 kg/m   Physical Exam Vitals and nursing note reviewed.  Constitutional:      Appearance: He is well-developed.  HENT:     Head: Normocephalic and atraumatic.     Right Ear: Tympanic membrane and ear canal normal. Decreased hearing noted.     Left Ear: Tympanic membrane and ear canal  normal. Decreased hearing noted.  Eyes:     Conjunctiva/sclera: Conjunctivae normal.  Cardiovascular:     Rate and Rhythm: Normal rate and regular rhythm.     Heart sounds: Normal heart sounds.  Pulmonary:     Effort: Pulmonary effort is normal.     Breath sounds: Normal breath sounds. No wheezing.  Abdominal:     General: Bowel sounds are normal.     Palpations: Abdomen is soft.     Tenderness: There is no abdominal tenderness.  Musculoskeletal:     Cervical back: Normal range of motion.     Right foot: Decreased range of motion. Normal capillary refill. Swelling present. No tenderness or bony tenderness. Normal pulse.     Comments: Patient has significant nontender swelling at his distal first metatarsal head.  Skin:    General: Skin is warm and dry.  Neurological:     Mental Status: He is alert.     (all labs ordered are listed, but only abnormal results are displayed) Labs Reviewed - No data to display  EKG: None  Radiology: No results found.    Procedures   Medications Ordered in the ED - No data to display                                  Medical Decision Making Patient presenting with 2 complaints, the first being severe hearing loss which is chronic without ear pain, tinnitus.  His exam is unremarkable with no cerumen impaction.  He is very hard of hearing, can only hear very loud voices, attempts to read lips but this is frequently also not effective.  I specked this is primary hearing loss, we discussed referral to ENT, friend at bedside was concerned about his ability to get to [facility], patient does not drive and the friend also does not have the ability to help in there.  He may benefit simply from evaluation for hearing aids.  He was given a referral to ENT but also given information regarding local hearing aid companies, there is a Scient[employer] research (life [employer]) company within walking distance of his address.  Imaging reviewed, severe arthritis at the MTP joint right great toe,  referral to local podiatry was given.  Friend at bedside also raises concerns about patient's living situation.  He lives in an apartment and apparently an outside person is responsible for paying his rent and utilities, patient has expressed concern about whether he is getting his entire monthly check.  He is not able to clarify who is currently handling his money or how that person got involved with him.  I reached out to our case manager who recommended that the patient may reach out to APS who may be able to help sort out this concern.  Given the fact that  it is extremely difficult to communicate with this patient I think the first priority is to address his hearing issue.  I have given him and his friend information regarding adult protective services, they may choose to call for further assistance.  Patient was also given referrals for obtaining a PCP which is also priority to help him with his other issues.  Of note patient is alert and oriented, communication is just extremely difficult.  Amount and/or Complexity of Data Reviewed Radiology: ordered.    Details: Imaging of his right foot reviewed, agree with interpretation, severe osteoarthritis.        Final diagnoses:  Osteoarthritis of toe joint, right  Hard of hearing    ED Discharge Orders     None          Birdena Mliss RIGGERS 08/30/23 1118    Suzette Pac, MD 08/31/23 1219

## 2023-08-27 NOTE — ED Triage Notes (Signed)
 Trouble hearing for a while now, over a year.  Also has swelling pain to RT foot for a long time, denies any injury.

## 2023-08-27 NOTE — Discharge Instructions (Addendum)
 You really need to consider getting a primary medical doctor who can manage your medical needs.  I am giving you some suggestions on primary doctors who are currently accepting new patients.  Your x-ray shows that you have severe arthritis in your big toe which is the source of your swelling.  I am referring you to a local podiatrist for this problem.  Also I think you need to see a hearing specialist to evaluate your hearing loss, you may need to have hearing aids to help you with this problem.  Please call Dr. Roark for an office visit.

## 2023-08-27 NOTE — ED Notes (Signed)
 ED Provider at bedside.

## 2023-10-08 ENCOUNTER — Telehealth: Payer: Self-pay

## 2023-10-08 NOTE — Telephone Encounter (Signed)
 Noted. Will ask for updated phone number & DPR when patient comes in.

## 2023-10-08 NOTE — Telephone Encounter (Signed)
 Copied from CRM #[id number]. Topic: General - Call Back - No Documentation >> Oct 08, 2023  9:00 AM Avram MATSU wrote: Reason for CRM: Sister Korbin Mapps stated she has no contact with her brother and stated she keeps getting texts about him. She stated he does not have a phone. [employer]:Sheryl Hardin [email or web address] deals with all billing but Donni stated there is no other person she can't think of to contact the pt.He has an appt for 9/4.  Please advise [phone] (M)

## 2023-10-09 ENCOUNTER — Ambulatory Visit: Admitting: Nurse Practitioner
